# Patient Record
Sex: Male | Born: 1980 | Race: White | Hispanic: No | Marital: Married | Smoking: Never smoker
Health system: Southern US, Community
[De-identification: ages and names within clinical notes are randomized; demographics above are authoritative.]

## PROBLEM LIST (undated history)

## (undated) DIAGNOSIS — K219 Gastro-esophageal reflux disease without esophagitis: Secondary | ICD-10-CM

## (undated) DIAGNOSIS — E785 Hyperlipidemia, unspecified: Secondary | ICD-10-CM

## (undated) DIAGNOSIS — R51 Headache: Secondary | ICD-10-CM

## (undated) DIAGNOSIS — I10 Essential (primary) hypertension: Secondary | ICD-10-CM

## (undated) DIAGNOSIS — R519 Headache, unspecified: Secondary | ICD-10-CM

## (undated) DIAGNOSIS — G43909 Migraine, unspecified, not intractable, without status migrainosus: Secondary | ICD-10-CM

## (undated) HISTORY — DX: Headache: R51

## (undated) HISTORY — DX: Hyperlipidemia, unspecified: E78.5

## (undated) HISTORY — DX: Headache, unspecified: R51.9

## (undated) HISTORY — DX: Migraine, unspecified, not intractable, without status migrainosus: G43.909

## (undated) HISTORY — DX: Essential (primary) hypertension: I10

## (undated) HISTORY — DX: Gastro-esophageal reflux disease without esophagitis: K21.9

---
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
## 2018-09-12 ENCOUNTER — Ambulatory Visit: Payer: Self-pay | Admitting: Family Medicine

## 2018-10-22 ENCOUNTER — Ambulatory Visit: Payer: BC Managed Care – PPO | Admitting: Family Medicine

## 2018-10-22 ENCOUNTER — Encounter: Payer: Self-pay | Admitting: Family Medicine

## 2018-10-22 VITALS — BP 130/90 | HR 74 | Temp 98.4°F | Resp 12 | Ht 71.0 in | Wt 257.4 lb

## 2018-10-22 DIAGNOSIS — E785 Hyperlipidemia, unspecified: Secondary | ICD-10-CM

## 2018-10-22 DIAGNOSIS — G43009 Migraine without aura, not intractable, without status migrainosus: Secondary | ICD-10-CM

## 2018-10-22 DIAGNOSIS — Z23 Encounter for immunization: Secondary | ICD-10-CM

## 2018-10-22 DIAGNOSIS — I10 Essential (primary) hypertension: Secondary | ICD-10-CM

## 2018-10-22 DIAGNOSIS — F411 Generalized anxiety disorder: Secondary | ICD-10-CM

## 2018-10-22 DIAGNOSIS — E559 Vitamin D deficiency, unspecified: Secondary | ICD-10-CM

## 2018-10-22 MED ORDER — PROPRANOLOL HCL ER 120 MG PO CP24: 120.0000 mg | ORAL_CAPSULE | Freq: Every day | ORAL | 2 refills | Status: DC

## 2018-10-22 MED ORDER — CITALOPRAM HYDROBROMIDE 20 MG PO TABS: 20.0000 mg | ORAL_TABLET | Freq: Every day | ORAL | 2 refills | Status: DC

## 2018-10-22 MED ORDER — SUMATRIPTAN SUCCINATE 100 MG PO TABS: 100.0000 mg | ORAL_TABLET | Freq: Every day | ORAL | 3 refills | Status: DC | PRN

## 2018-10-22 NOTE — Patient Instructions (Addendum)
A few things to remember from today's visit:   Migraine without aura and without status migrainosus, not intractable - Plan: propranolol ER (INDERAL LA) 120 MG 24 hr capsule, SUMAtriptan (IMITREX) 100 MG tablet  Hypertension, essential, benign - Plan: Comprehensive metabolic panel, propranolol ER (INDERAL LA) 120 MG 24 hr capsule  Hyperlipidemia, unspecified hyperlipidemia type - Plan: Comprehensive metabolic panel, Lipid panel  GAD (generalized anxiety disorder) - Plan: citalopram (CELEXA) 20 MG tablet  Vitamin D deficiency, unspecified - Plan: VITAMIN D 25 Hydroxy (Vit-D Deficiency, Fractures)  Continue monitoring blood pressures.  Please be sure medication list is accurate. If a new problem present, please set up appointment sooner than planned today.

## 2018-10-22 NOTE — Progress Notes (Signed)
HPI:   Leon Mccullough is a 38 y.o. male, who is here today to establish care.  Former PCP: N/A Last preventive routine visit: >a year ago.  Chronic medical problems: Migraine,HLD,elevated blood pressure,vit D deficiency,and anxiety among some.  He takes Imitrex for migraines. Temporal,either side, no visual aura. Associated nausea and photophobia. No vomiting. When he takes Imitrex 100 mg headache lasts about 5 hours.  10-12 headaches per months. Exacerbated by stress.  Anxiety,he is on Citalopram 20 mg daily. Dose was decreased from 40 mg, this dose caused biting cheeks. + Palpitation with episodes of acute anxiety.  Denies depressed mood or suicidal thoughts. Problem exacerbated by stress.  Concerns today: Elevated BP  He has been checking BP and it has been elevated. 130's-150's/70-80's. Denies visual changes, chest pain, dyspnea, claudication, focal weakness, or edema.   HLD, last FLP 6 months ago. LDL was 194. He takes OTC Fish oil 1000 mg daily and Pravastatin 10 mg daily. He is tolerating med well,no side effects noted.  He has not been consistent with a healthful diet. He is not exercising regularly but planning in doing so.  Vit D deficiency, he is on Vit D 5000 U daily. Dx about 6 years ago.   Review of Systems  Constitutional: Negative for activity change, appetite change, fatigue and fever.  HENT: Negative for nosebleeds, sore throat and trouble swallowing.   Eyes: Negative for redness and visual disturbance.  Respiratory: Negative for cough, shortness of breath and wheezing.   Cardiovascular: Positive for palpitations. Negative for chest pain and leg swelling.  Gastrointestinal: Negative for abdominal pain, nausea and vomiting.  Genitourinary: Negative for decreased urine volume and hematuria.  Musculoskeletal: Negative for gait problem and myalgias.  Neurological: Positive for headaches. Negative for dizziness, syncope and weakness.    Psychiatric/Behavioral: Negative for confusion. The patient is nervous/anxious.       Current Outpatient Medications on File Prior to Visit  Medication Sig Dispense Refill  . Cholecalciferol (VITAMIN D3) 50 MCG (2000 UT) TABS Take 50 mcg by mouth 2 (two) times a week.    . Multiple Vitamins-Minerals (MULTIVITAMIN WITH MINERALS) tablet Take 1 tablet by mouth 2 (two) times a week.    . Omega-3 Fatty Acids (SUPER OMEGA 3 EPA/DHA PO) Take 100 mg by mouth daily.    . pravastatin (PRAVACHOL) 10 MG tablet Take 10 mg by mouth daily.     No current facility-administered medications on file prior to visit.      Past Medical History:  Diagnosis Date  . Frequent headaches   . GERD (gastroesophageal reflux disease)   . Hyperlipidemia   . Hypertension   . Migraines    No Known Allergies  Family History  Problem Relation Age of Onset  . Hyperlipidemia Mother   . Hyperlipidemia Father   . Cancer Maternal Grandfather   . Heart attack Paternal Grandmother   . Stroke Paternal Grandfather     Social History   Socioeconomic History  . Marital status: Married    Spouse name: Not on file  . Number of children: Not on file  . Years of education: Not on file  . Highest education level: Not on file  Occupational History  . Not on file  Social Needs  . Financial resource strain: Not on file  . Food insecurity:    Worry: Not on file    Inability: Not on file  . Transportation needs:    Medical: Not on file  Non-medical: Not on file  Tobacco Use  . Smoking status: Never Smoker  . Smokeless tobacco: Never Used  Substance and Sexual Activity  . Alcohol use: Not Currently  . Drug use: Never  . Sexual activity: Yes    Partners: Female  Lifestyle  . Physical activity:    Days per week: Not on file    Minutes per session: Not on file  . Stress: Not on file  Relationships  . Social connections:    Talks on phone: Not on file    Gets together: Not on file    Attends religious  service: Not on file    Active member of club or organization: Not on file    Attends meetings of clubs or organizations: Not on file    Relationship status: Not on file  Other Topics Concern  . Not on file  Social History Narrative  . Not on file    Vitals:   10/22/18 1411  BP: 130/90  Pulse: 74  Resp: 12  Temp: 98.4 F (36.9 C)  SpO2: 97%    Body mass index is 35.9 kg/m.  Physical Exam  Nursing note reviewed. Constitutional: He is oriented to person, place, and time. He appears well-developed. No distress.  HENT:  Head: Normocephalic and atraumatic.  Mouth/Throat: Oropharynx is clear and moist and mucous membranes are normal.  Eyes: Pupils are equal, round, and reactive to light. Conjunctivae are normal.  Cardiovascular: Normal rate and regular rhythm.  No murmur heard. Pulses:      Dorsalis pedis pulses are 2+ on the right side and 2+ on the left side.  Respiratory: Effort normal and breath sounds normal. No respiratory distress.  GI: Soft. He exhibits no mass. There is no hepatomegaly. There is no abdominal tenderness.  Musculoskeletal:        General: No edema.  Lymphadenopathy:    He has no cervical adenopathy.  Neurological: He is alert and oriented to person, place, and time. He has normal strength. No cranial nerve deficit. Gait normal.  Skin: Skin is warm. No rash noted. No erythema.  Psychiatric: He has a normal mood and affect.  Well groomed, good eye contact.     ASSESSMENT AND PLAN:  Leon Mccullough was seen today for establish care.  Diagnoses and all orders for this visit:  Lab Results  Component Value Date   ALT 46 10/24/2018   AST 27 10/24/2018   ALKPHOS 56 10/24/2018   BILITOT 0.5 10/24/2018   Lab Results  Component Value Date   CHOL 195 10/24/2018   HDL 37.60 (L) 10/24/2018   LDLCALC 124 (H) 10/24/2018   TRIG 167.0 (H) 10/24/2018   CHOLHDL 5 10/24/2018   Lab Results  Component Value Date   CREATININE 0.84 10/24/2018   BUN 13  10/24/2018   NA 139 10/24/2018   K 4.4 10/24/2018   CL 102 10/24/2018   CO2 31 10/24/2018    Migraine without aura and without status migrainosus, not intractable Having several episodes per months. After discussion of a few treatment options,he  Agrees with trying Propranolol. Side effects discussed. F/U in 2 months,before if needed.  -     propranolol ER (INDERAL LA) 120 MG 24 hr capsule; Take 1 capsule (120 mg total) by mouth daily. -     SUMAtriptan (IMITREX) 100 MG tablet; Take 1 tablet (100 mg total) by mouth daily as needed for migraine (can repeat dose 2 hours after if needed.). May repeat in 2 hours if headache  persists or recurs.  Hypertension, essential, benign Not well controlled. Possible complications of elevated BP discussed. Propranolol started today. Continue monitoring BP at home. Instructed about warning signs. F/u in 2 months,before if needed.  -     Comprehensive metabolic panel; Future -     propranolol ER (INDERAL LA) 120 MG 24 hr capsule; Take 1 capsule (120 mg total) by mouth daily.  Hyperlipidemia, unspecified hyperlipidemia type No changes in current management, will follow labs done today and will give further recommendations accordingly.  -     Comprehensive metabolic panel; Future -     Lipid panel; Future  GAD (generalized anxiety disorder) Stable. No changes in current management.  -     citalopram (CELEXA) 20 MG tablet; Take 1 tablet (20 mg total) by mouth daily.  Vitamin D deficiency, unspecified No changes in current management, will follow 2225 [place] vit D done today and will give further recommendations accordingly.  -     VITAMIN D 25 Hydroxy (Vit-D Deficiency, Fractures); Future      Return in about 2 months (around 12/21/2018) for HTN and HA. Labs in 2 days.      Betty G. Swazil[place]Jordan, MD  [facility]. [place] office.

## 2018-10-24 ENCOUNTER — Other Ambulatory Visit (INDEPENDENT_AMBULATORY_CARE_PROVIDER_SITE_OTHER): Payer: BC Managed Care – PPO

## 2018-10-24 DIAGNOSIS — E559 Vitamin D deficiency, unspecified: Secondary | ICD-10-CM | POA: Diagnosis not present

## 2018-10-24 DIAGNOSIS — E785 Hyperlipidemia, unspecified: Secondary | ICD-10-CM | POA: Diagnosis not present

## 2018-10-24 DIAGNOSIS — I10 Essential (primary) hypertension: Secondary | ICD-10-CM | POA: Diagnosis not present

## 2018-10-24 LAB — LIPID PANEL
CHOL/HDL RATIO: 5
Cholesterol: 195 mg/dL (ref 0–200)
HDL: 37.6 mg/dL — ABNORMAL LOW (ref >39.00)
LDL Cholesterol: 124 mg/dL — ABNORMAL HIGH (ref 0–99)
NonHDL: 157.83
Triglycerides: 167 mg/dL — ABNORMAL HIGH (ref 0.0–149.0)
VLDL: 33.4 mg/dL (ref 0.0–40.0)

## 2018-10-24 LAB — COMPREHENSIVE METABOLIC PANEL
ALT: 46 U/L (ref 0–53)
AST: 27 U/L (ref 0–37)
Albumin: 4.4 g/dL (ref 3.5–5.2)
Alkaline Phosphatase: 56 U/L (ref 39–117)
BUN: 13 mg/dL (ref 6–23)
CO2: 31 meq/L (ref 19–32)
Calcium: 9.8 mg/dL (ref 8.4–10.5)
Chloride: 102 mEq/L (ref 96–112)
Creatinine, Ser: 0.84 mg/dL (ref 0.40–1.50)
GFR: 102.76 mL/min (ref >60.00)
Glucose, Bld: 86 mg/dL (ref 70–99)
Potassium: 4.4 mEq/L (ref 3.5–5.1)
Sodium: 139 mEq/L (ref 135–145)
Total Bilirubin: 0.5 mg/dL (ref 0.2–1.2)
Total Protein: 7 g/dL (ref 6.0–8.3)

## 2018-10-24 LAB — VITAMIN D 25 HYDROXY (VIT D DEFICIENCY, FRACTURES): VITD: 23.45 ng/mL — ABNORMAL LOW (ref 30.00–100.00)

## 2018-10-25 MED ORDER — PRAVASTATIN SODIUM 20 MG PO TABS: 20.0000 mg | ORAL_TABLET | Freq: Every day | ORAL | 2 refills | Status: DC

## 2018-10-25 MED ORDER — VITAMIN D (ERGOCALCIFEROL) 1.25 MG (50000 UNIT) PO CAPS: 50000.0000 [IU] | ORAL_CAPSULE | ORAL | 1 refills | Status: DC

## 2018-10-31 ENCOUNTER — Telehealth: Payer: Self-pay | Admitting: Family Medicine

## 2018-10-31 NOTE — Telephone Encounter (Signed)
Copied from CRM [id number]. Topic: General - Other >> Oct 31, 2018 10:47 AM Gerrianne Scale wrote: Reason for CRM: pt calling about lab results

## 2018-12-21 ENCOUNTER — Ambulatory Visit: Payer: BC Managed Care – PPO | Admitting: Family Medicine

## 2019-01-14 ENCOUNTER — Other Ambulatory Visit: Payer: Self-pay | Admitting: Family Medicine

## 2019-01-14 DIAGNOSIS — I10 Essential (primary) hypertension: Secondary | ICD-10-CM

## 2019-01-14 DIAGNOSIS — G43009 Migraine without aura, not intractable, without status migrainosus: Secondary | ICD-10-CM

## 2019-01-30 ENCOUNTER — Ambulatory Visit: Payer: Self-pay

## 2019-01-30 NOTE — Telephone Encounter (Signed)
Pt called to say that he is having headaches after running out of his migraine/BP medication. Per patient the doctor prescribed the Propanolol for migraines and to reduce his BP. Monday his BP 152/76, Tuesday BP 158/68 HR range from 76-80. He states that his headache is different from his migraine HA.  It starts on the top of his head instead of his temple. He has been out of his medication for 4 days. He rates his headache pain at 6-7. He has no blurred vision weakness on one side of his body. Care advice read to patient. Pt verbalized understanding of all instructions.  Note will be routed to office for appointment in the AM. E-mail verified. Reason for Disposition . Ran out of BP medications  Additional Information . Commented on: Systolic BP  >= 160 OR Diastolic >= 100    Having headaches  Answer Assessment - Initial Assessment Questions 1. BLOOD PRESSURE: "What is the blood pressure?" "Did you take at least two measurements 5 minutes apart?"     152/76 158/68 Hr 76-80 on Monday and Tuesday 2. ONSET: "When did you take your blood pressure?"     This week 3. HOW: "How did you obtain the blood pressure?" (e.g., visiting nurse, automatic home BP monitor)     Home machine 4. HISTORY: "Do you have a history of high blood pressure?"    HTN 5. MEDICATIONS: "Are you taking any medications for blood pressure?" "Have you missed any doses recently?"    Propanolol 6. OTHER SYMPTOMS: "Do you have any symptoms?" (e.g., headache, chest pain, blurred vision, difficulty breathing, weakness)     Headache,  7. PREGNANCY: "Is there any chance you are pregnant?" "When was your last menstrual period?"    N/A  Protocols used: HIGH BLOOD PRESSURE-A-AH

## 2019-01-30 NOTE — Telephone Encounter (Signed)
Call returned to patient. Left VM to return call to office. 

## 2019-02-04 ENCOUNTER — Ambulatory Visit: Payer: BC Managed Care – PPO | Admitting: Family Medicine

## 2019-02-06 ENCOUNTER — Other Ambulatory Visit: Payer: Self-pay

## 2019-02-06 ENCOUNTER — Ambulatory Visit (INDEPENDENT_AMBULATORY_CARE_PROVIDER_SITE_OTHER): Payer: BC Managed Care – PPO | Admitting: Family Medicine

## 2019-02-06 ENCOUNTER — Encounter: Payer: Self-pay | Admitting: Family Medicine

## 2019-02-06 DIAGNOSIS — I10 Essential (primary) hypertension: Secondary | ICD-10-CM

## 2019-02-06 DIAGNOSIS — F411 Generalized anxiety disorder: Secondary | ICD-10-CM

## 2019-02-06 DIAGNOSIS — G43009 Migraine without aura, not intractable, without status migrainosus: Secondary | ICD-10-CM | POA: Diagnosis not present

## 2019-02-06 MED ORDER — PROPRANOLOL HCL ER 120 MG PO CP24: 120.0000 mg | ORAL_CAPSULE | Freq: Every day | ORAL | 2 refills | Status: DC

## 2019-02-06 NOTE — Progress Notes (Signed)
Virtual Visit via Video Note   I connected with Leon Mccullough on 02/06/19 at  3:30 PM EDT by a video enabled telemedicine application and verified that I am speaking with the correct person using two identifiers.  Location patient: home Location provider:work or home office Persons participating in the virtual visit: patient, provider  I discussed the limitations of evaluation and management by telemedicine and the availability of in person appointments.He expressed understanding and agreed to proceed.   HPI: Leon Mccullough is a 38 yo male following on some chronic medical problems.  He was last seen on 10/22/2018, 2 months follow-up was recommended.  Migraines, he was started on Inderal LA 120 mg daily and Imitrex 100 mg to take if needed. Since his last OV he had an episode of mild right temporofrontal headache that lasted about 30 minutes. He has not had migraine episodes. He is tolerating med well ,no side effects.  Hypertension, is taking Inderal LA 120 mg daily. BP readings with med 120-130's/80's. He ran out of medication for about 7 to 8 days, BP was elevated at 168/77. Denies visual changes, chest pain, dyspnea, palpitations, abdominal pain, nausea, vomiting, edema, or focal deficit.  Lab Results  Component Value Date   CREATININE 0.84 10/24/2018   BUN 13 10/24/2018   NA 139 10/24/2018   K 4.4 10/24/2018   CL 102 10/24/2018   CO2 31 10/24/2018   Anxiety, currently he is on Celexa 20 mg daily. Medication has helped with symptoms. He thinks he is doing well with current situation related with COVID-19 pandemia. He denies depressed mood or suicidal thoughts.   ROS: See pertinent positives and negatives per HPI.  Past Medical History:  Diagnosis Date  . Frequent headaches   . GERD (gastroesophageal reflux disease)   . Hyperlipidemia   . Hypertension   . Migraines     Past Surgical History:  Procedure Laterality Date  . TONSILLECTOMY AND ADENOIDECTOMY  2001     Family History  Problem Relation Age of Onset  . Hyperlipidemia Mother   . Hyperlipidemia Father   . Cancer Maternal Grandfather   . Heart attack Paternal Grandmother   . Stroke Paternal Grandfather     Social History   Socioeconomic History  . Marital status: Married    Spouse name: Not on file  . Number of children: Not on file  . Years of education: Not on file  . Highest education level: Not on file  Occupational History  . Not on file  Social Needs  . Financial resource strain: Not on file  . Food insecurity:    Worry: Not on file    Inability: Not on file  . Transportation needs:    Medical: Not on file    Non-medical: Not on file  Tobacco Use  . Smoking status: Never Smoker  . Smokeless tobacco: Never Used  Substance and Sexual Activity  . Alcohol use: Not Currently  . Drug use: Never  . Sexual activity: Yes    Partners: Female  Lifestyle  . Physical activity:    Days per week: Not on file    Minutes per session: Not on file  . Stress: Not on file  Relationships  . Social connections:    Talks on phone: Not on file    Gets together: Not on file    Attends religious service: Not on file    Active member of club or organization: Not on file    Attends meetings of clubs or organizations:  Not on file    Relationship status: Not on file  . Intimate partner violence:    Fear of current or ex partner: Not on file    Emotionally abused: Not on file    Physically abused: Not on file    Forced sexual activity: Not on file  Other Topics Concern  . Not on file  Social History Narrative  . Not on file      Current Outpatient Medications:  .  Cholecalciferol (VITAMIN D3) 50 MCG (2000 UT) TABS, Take 50 mcg by mouth 2 (two) times a week., Disp: , Rfl:  .  citalopram (CELEXA) 20 MG tablet, Take 1 tablet (20 mg total) by mouth daily., Disp: 90 tablet, Rfl: 2 .  Multiple Vitamins-Minerals (MULTIVITAMIN WITH MINERALS) tablet, Take 1 tablet by mouth 2 (two)  times a week., Disp: , Rfl:  .  Omega-3 Fatty Acids (SUPER OMEGA 3 EPA/DHA PO), Take 100 mg by mouth daily., Disp: , Rfl:  .  pravastatin (PRAVACHOL) 20 MG tablet, Take 1 tablet (20 mg total) by mouth daily., Disp: 90 tablet, Rfl: 2 .  propranolol ER (INDERAL LA) 120 MG 24 hr capsule, Take 1 capsule (120 mg total) by mouth daily., Disp: 90 capsule, Rfl: 2 .  SUMAtriptan (IMITREX) 100 MG tablet, Take 1 tablet (100 mg total) by mouth daily as needed for migraine (can repeat dose 2 hours after if needed.). May repeat in 2 hours if headache persists or recurs., Disp: 10 tablet, Rfl: 3 .  Vitamin D, Ergocalciferol, (DRISDOL) 1.25 MG (50000 UT) CAPS capsule, Take 1 capsule (50,000 Units total) by mouth every 7 (seven) days., Disp: 12 capsule, Rfl: 1  EXAM:  VITALS per patient if applicable:BP 132/76   Pulse 64   Resp 12   GENERAL: alert, oriented, appears well and in no acute distress  HEENT: atraumatic, conjunctiva clear, no obvious facial abnormalities on inspection.  LUNGS: on inspection no signs of respiratory distress, breathing rate appears normal, no obvious gross SOB, gasping or wheezing  CV: no obvious cyanosis  MS: moves all visible extremities without noticeable abnormality  PSYCH/NEURO: pleasant and cooperative, no obvious depression or anxiety, speech and thought processing grossly intact  ASSESSMENT AND PLAN:  Discussed the following assessment and plan:  Hypertension, essential, benign BP better controlled. No changes in Inderal LA 120 mg daily. Continue low salt diet. Continue monitoring BP regularly. Annual eye exam. F/U in 4-5 months.  GAD (generalized anxiety disorder) Still well controlled with Celexa 20 mg daily. No changes today.  Migraine without aura and without status migrainosus, not intractable Great improvement in headache frequency and intensity. Continue Inderal LA 120 mg daily. Instructed about warning signs. F/U in 4-5 months.     I discussed  the assessment and treatment plan with the patient. He was provided an opportunity to ask questions and all were answered. The patient agreed with the plan and demonstrated an understanding of the instructions.    Return in about 5 months (around 07/09/2019) for HTN,migraine.    Catelyn Friel [place], MD

## 2019-02-06 NOTE — Assessment & Plan Note (Signed)
BP better controlled. No changes in Inderal LA 120 mg daily. Continue low salt diet. Continue monitoring BP regularly. Annual eye exam. F/U in 4-5 months.

## 2019-02-06 NOTE — Assessment & Plan Note (Signed)
Still well controlled with Celexa 20 mg daily. No changes today.

## 2019-02-06 NOTE — Assessment & Plan Note (Signed)
Great improvement in headache frequency and intensity. Continue Inderal LA 120 mg daily. Instructed about warning signs. F/U in 4-5 months.

## 2019-04-16 ENCOUNTER — Other Ambulatory Visit: Payer: Self-pay | Admitting: Family Medicine

## 2019-04-16 DIAGNOSIS — E559 Vitamin D deficiency, unspecified: Secondary | ICD-10-CM

## 2019-07-11 ENCOUNTER — Other Ambulatory Visit: Payer: Self-pay | Admitting: Family Medicine

## 2019-07-11 DIAGNOSIS — E785 Hyperlipidemia, unspecified: Secondary | ICD-10-CM

## 2019-07-23 ENCOUNTER — Other Ambulatory Visit: Payer: Self-pay | Admitting: Family Medicine

## 2019-07-23 DIAGNOSIS — F411 Generalized anxiety disorder: Secondary | ICD-10-CM

## 2019-10-01 ENCOUNTER — Other Ambulatory Visit: Payer: Self-pay | Admitting: Family Medicine

## 2019-10-01 DIAGNOSIS — E559 Vitamin D deficiency, unspecified: Secondary | ICD-10-CM

## 2019-10-19 ENCOUNTER — Other Ambulatory Visit: Payer: Self-pay | Admitting: Family Medicine

## 2019-10-19 DIAGNOSIS — G43009 Migraine without aura, not intractable, without status migrainosus: Secondary | ICD-10-CM

## 2019-10-19 DIAGNOSIS — I10 Essential (primary) hypertension: Secondary | ICD-10-CM

## 2019-12-19 ENCOUNTER — Ambulatory Visit: Payer: BC Managed Care – PPO | Attending: Family

## 2019-12-19 DIAGNOSIS — Z23 Encounter for immunization: Secondary | ICD-10-CM

## 2019-12-19 NOTE — Progress Notes (Signed)
   Covid-19 Vaccination Clinic  Name:  Chaysen Tillman    MRN: [id number] DOB: 1981-09-26  12/19/2019  Mr. Withrow was observed post Covid-19 immunization for 15 minutes without incident. He was provided with Vaccine Information Sheet and instruction to access the V-Safe system.   Mr. Mallek was instructed to call 911 with any severe reactions post vaccine: Marland Kitchen Difficulty breathing  . Swelling of face and throat  . A fast heartbeat  . A bad rash all over body  . Dizziness and weakness   Immunizations Administered    Name Date Dose VIS Date Route   Moderna COVID-19 Vaccine 12/19/2019 11:02 AM 0.5 mL 08/27/2019 Intramuscular   Manufacturer: Moderna   Lot: [id number]   NDC: 43837-793-96

## 2020-01-21 ENCOUNTER — Ambulatory Visit: Payer: BC Managed Care – PPO | Attending: Family

## 2020-01-21 DIAGNOSIS — Z23 Encounter for immunization: Secondary | ICD-10-CM

## 2020-01-21 NOTE — Progress Notes (Signed)
   Covid-19 Vaccination Clinic  Name:  Leon Mccullough    MRN: [id number] DOB: 06-Jan-1981  01/21/2020  [name] was observed post Covid-19 immunization for 15 minutes without incident. He was provided with Vaccine Information Sheet and instruction to access the V-Safe system.   [name] was instructed to call 911 with any severe reactions post vaccine: Marland Kitchen Difficulty breathing  . Swelling of face and throat  . A fast heartbeat  . A bad rash all over body  . Dizziness and weakness   Immunizations Administered    Name Date Dose VIS Date Route   Moderna COVID-19 Vaccine 01/21/2020  3:04 PM 0.5 mL 08/2019 Intramuscular   Manufacturer: Moderna   Lot: [id number]   NDC: 88933-882-66

## 2020-03-13 ENCOUNTER — Other Ambulatory Visit: Payer: Self-pay | Admitting: Family Medicine

## 2020-03-13 DIAGNOSIS — E559 Vitamin D deficiency, unspecified: Secondary | ICD-10-CM

## 2020-04-08 ENCOUNTER — Other Ambulatory Visit: Payer: Self-pay

## 2020-04-08 ENCOUNTER — Encounter: Payer: Self-pay | Admitting: Family Medicine

## 2020-04-08 ENCOUNTER — Ambulatory Visit (INDEPENDENT_AMBULATORY_CARE_PROVIDER_SITE_OTHER): Payer: BC Managed Care – PPO | Admitting: Family Medicine

## 2020-04-08 VITALS — BP 134/80 | HR 64 | Ht 71.0 in | Wt 267.0 lb

## 2020-04-08 DIAGNOSIS — G43009 Migraine without aura, not intractable, without status migrainosus: Secondary | ICD-10-CM

## 2020-04-08 DIAGNOSIS — Z0000 Encounter for general adult medical examination without abnormal findings: Secondary | ICD-10-CM | POA: Diagnosis not present

## 2020-04-08 DIAGNOSIS — E785 Hyperlipidemia, unspecified: Secondary | ICD-10-CM | POA: Diagnosis not present

## 2020-04-08 DIAGNOSIS — F411 Generalized anxiety disorder: Secondary | ICD-10-CM

## 2020-04-08 DIAGNOSIS — E559 Vitamin D deficiency, unspecified: Secondary | ICD-10-CM | POA: Diagnosis not present

## 2020-04-08 DIAGNOSIS — Z23 Encounter for immunization: Secondary | ICD-10-CM

## 2020-04-08 DIAGNOSIS — Z130 Encounter for screening for diseases of the blood and blood-forming organs and certain disorders involving the immune mechanism: Secondary | ICD-10-CM

## 2020-04-08 DIAGNOSIS — Z1329 Encounter for screening for other suspected endocrine disorder: Secondary | ICD-10-CM

## 2020-04-08 DIAGNOSIS — Z114 Encounter for screening for human immunodeficiency virus [HIV]: Secondary | ICD-10-CM

## 2020-04-08 DIAGNOSIS — Z1159 Encounter for screening for other viral diseases: Secondary | ICD-10-CM

## 2020-04-08 DIAGNOSIS — Z13228 Encounter for screening for other metabolic disorders: Secondary | ICD-10-CM

## 2020-04-08 DIAGNOSIS — Z789 Other specified health status: Secondary | ICD-10-CM

## 2020-04-08 DIAGNOSIS — I10 Essential (primary) hypertension: Secondary | ICD-10-CM | POA: Diagnosis not present

## 2020-04-08 NOTE — Assessment & Plan Note (Signed)
BP adequately controlled. Continue Inderal LA and 20 mg daily. Low-salt diet. Monitor BP regularly.

## 2020-04-08 NOTE — Assessment & Plan Note (Signed)
Problem is well controlled. Continue Celexa 20 mg daily.

## 2020-04-08 NOTE — Assessment & Plan Note (Signed)
Problem is better controlled. No changes in current management.

## 2020-04-08 NOTE — Assessment & Plan Note (Signed)
No changes in current management, will follow labs done today and will give further recommendations accordingly.  

## 2020-04-08 NOTE — Patient Instructions (Addendum)
A few things to remember from today's visit:   Routine general medical examination at a health care facility  Vitamin D deficiency, unspecified  Hypertension, essential, benign - Plan: Comprehensive metabolic panel, Lipid panel  Hyperlipidemia, unspecified hyperlipidemia type - Plan: Comprehensive metabolic panel  Encounter for HCV screening test for low risk patient - Plan: Hepatitis C antibody screen  Encounter for screening for HIV - Plan: HIV antibody  Screening for endocrine, metabolic and immunity disorder - Plan: Hemoglobin A1c  If you need refills please call your pharmacy. Do not use My Chart to request refills or for acute issues that need immediate attention.    Please be sure medication list is accurate. If a new problem present, please set up appointment sooner than planned today.   At least 150 minutes of moderate exercise per week, daily brisk walking for 15-30 min is a good exercise option. Healthy diet low in saturated (animal) fats and sweets and consisting of fresh fruits and vegetables, lean meats such as fish and white chicken and whole grains.  - Vaccines:  Tdap vaccine every 10 years.  Shingles vaccine recommended at age 73, could be given after 39 years of age but not sure about insurance coverage.  Pneumonia vaccines: Pneumovax at 665   -Screening recommendations for low/normal risk males:  Screening for diabetes at age 42 and every 3 years. Earlier screening if cardiovascular risk factors.   Lipid screening at 35 and every 3 years. Screening starts in younger males with cardiovascular risk factors. N/A  Colon cancer screening is now at age 53 but your insurance may not cover until age 41 .screening is recommended age 69.  Prostate cancer screening: some controversy, starts usually at 50: Rectal exam and PSA.  Aortic Abdominal Aneurism once between 63 and 4 years old if ever smoker.  Also recommended:  1. Dental visit- Brush and floss your  teeth twice daily; visit your dentist twice a year. 2. Eye doctor- Get an eye exam at least every 2 years. 3. Helmet use- Always wear a helmet when riding a bicycle, motorcycle, rollerblading or skateboarding. 4. Safe sex- If you may be exposed to sexually transmitted infections, use a condom. 5. Seat belts- Seat belts can save your live; always wear one. 6. Smoke/Carbon Monoxide detectors- These detectors need to be installed on the appropriate level of your home. Replace batteries at least once a year. 7. Skin cancer- When out in the sun please cover up and use sunscreen 15 SPF or higher. 8. Violence- If anyone is threatening or hurting you, please tell your healthcare provider.  9. Drink alcohol in moderation- Limit alcohol intake to one drink or less per day. Never drink and drive.

## 2020-04-08 NOTE — Progress Notes (Signed)
HPI:  Mr. Leon Mccullough is a 39 y.o.male here today for his routine physical examination.  Last CPE: 1-2 years ago. He lives with with his wife.  Regular exercise 3 or more times per week: 3 to 4 weeks ago he started swimming every times per week. Following a healthy diet: He tries, in general he does follow a healthful diet.  He is cakes once per week and candy 2-3 times per week.  Chronic medical problems: Hypertension, migraine, anxiety, vitamin D deficiency, and hyperlipidemia among some.  Hx of STD's: Negative.  Immunization History  Administered Date(s) Administered   Influenza,inj,Quad PF,6+ Mos 07/25/2018   MMR 04/08/2020   Moderna SARS-COVID-2 Vaccination 12/19/2019, 01/21/2020   Tdap 10/22/2018   -Hep C screening: Never. Last colon cancer screening: N/A Last prostate ca screening: N/A  -Negative for high alcohol intake, tobacco use, or Hx of illicit drug use.  -Concerns and/or follow up today:   NUU:[id number] is on pravastatin 20 mg daily.  Lab Results  Component Value Date   CHOL 195 10/24/2018   HDL 37.60 (L) 10/24/2018   LDLCALC 124 (H) 10/24/2018   TRIG 167.0 (H) 10/24/2018   CHOLHDL 5 10/24/2018   Hypertension: Currently he is on Inderal LA 120 mg daily.  Lab Results  Component Value Date   CREATININE 0.84 10/24/2018   BUN 13 10/24/2018   NA 139 10/24/2018   K 4.4 10/24/2018   CL 102 10/24/2018   CO2 31 10/24/2018   Migraines headaches have improved since he started Inderal. He has 2 mild headaches per month. Tolerating medication well.  Anxiety: He is on Celexa 20 mg daily. Negative for depressed mood.  Review of Systems  Constitutional: Negative for activity change, appetite change, fatigue and fever.  HENT: Negative for dental problem, nosebleeds, sore throat and trouble swallowing.   Eyes: Negative for redness and visual disturbance.  Respiratory: Negative for cough, shortness of breath and wheezing.   Cardiovascular: Negative  for chest pain, palpitations and leg swelling.  Gastrointestinal: Negative for abdominal pain, blood in stool, nausea and vomiting.  Endocrine: Negative for cold intolerance, heat intolerance, polydipsia, polyphagia and polyuria.  Genitourinary: Negative for decreased urine volume, discharge, dysuria, genital sores, hematuria and testicular pain.  Musculoskeletal: Negative for gait problem and myalgias. Occasional shooting pain left elbow,medial aspect, radiated to 4th-5th finger and just with certain positions(applying pressure on area) in bed. Skin: Negative for color change and rash.  Allergic/Immunologic: Negative for environmental allergies.  Neurological: Negative for syncope, weakness and facial asymmetry. Hematological: Negative for adenopathy. Does not bruise/bleed easily.  Psychiatric/Behavioral: Negative for confusion. The patient is not nervous/anxious.   All other systems reviewed and are negative.  Current Outpatient Medications on File Prior to Visit  Medication Sig Dispense Refill   Cholecalciferol (VITAMIN D3) 50 MCG (2000 UT) TABS Take 50 mcg by mouth 2 (two) times a week.     citalopram (CELEXA) 20 MG tablet TAKE 1 TABLET BY MOUTH EVERY DAY 90 tablet 2   Multiple Vitamins-Minerals (MULTIVITAMIN WITH MINERALS) tablet Take 1 tablet by mouth 2 (two) times a week.     Omega-3 Fatty Acids (SUPER OMEGA 3 EPA/DHA PO) Take 100 mg by mouth daily.     pravastatin (PRAVACHOL) 20 MG tablet TAKE 1 TABLET BY MOUTH EVERY DAY 90 tablet 2   propranolol ER (INDERAL LA) 120 MG 24 hr capsule TAKE 1 CAPSULE BY MOUTH EVERY DAY 90 capsule 1   SUMAtriptan (IMITREX) 100 MG tablet Take 1 tablet (100 mg  total) by mouth daily as needed for migraine (can repeat dose 2 hours after if needed.). May repeat in 2 hours if headache persists or recurs. 10 tablet 3   No current facility-administered medications on file prior to visit.   Past Medical History:  Diagnosis Date   Frequent headaches     GERD (gastroesophageal reflux disease)    Hyperlipidemia    Hypertension    Migraines    Past Surgical History:  Procedure Laterality Date   TONSILLECTOMY AND ADENOIDECTOMY  2001   No Known Allergies  Family History  Problem Relation Age of Onset   Hyperlipidemia Mother    Hyperlipidemia Father    Cancer Maternal Grandfather    Heart attack Paternal Grandmother    Stroke Paternal Grandfather    Social History   Socioeconomic History   Marital status: Married    Spouse name: Not on file   Number of children: Not on file   Years of education: Not on file   Highest education level: Not on file  Occupational History   Not on file  Tobacco Use   Smoking status: Never Smoker   Smokeless tobacco: Never Used  Vaping Use   Vaping Use: Never assessed  Substance and Sexual Activity   Alcohol use: Not Currently   Drug use: Never   Sexual activity: Yes    Partners: Female  Other Topics Concern   Not on file  Social History Narrative   Not on file   Social Determinants of Health   Financial Resource Strain:    Difficulty of Paying Living Expenses:   Food Insecurity:    Worried About Charity fundraiser in the Last Year:    Arboriculturist in the Last Year:   Transportation Needs:    Film/video editor (Medical):    Lack of Transportation (Non-Medical):   Physical Activity:    Days of Exercise per Week:    Minutes of Exercise per Session:   Stress:    Feeling of Stress :   Social Connections:    Frequency of Communication with Friends and Family:    Frequency of Social Gatherings with Friends and Family:    Attends Religious Services:    Active Member of Clubs or Organizations:    Attends Archivist Meetings:    Marital Status:     Today's Vitals   04/08/20 1113  BP: 134/80  Pulse: 64  SpO2: 96%  Weight: 267 lb (121.1 kg)  Height: '5\' 11"'  (1.803 m)   Body mass index is 37.24 kg/m.  Wt Readings from Last 3  Encounters:  04/08/20 267 lb (121.1 kg)  10/22/18 257 lb 6 oz (116.7 kg)   Physical Exam  Nursing note and vitals reviewed. Constitutional: He is oriented to person, place, and time. He appears well-developed. No distress.  HENT:  Head: Normocephalic and atraumatic.  Right Ear: Tympanic membrane, external ear and ear canal normal.  Left Ear: Tympanic membrane, external ear and ear canal normal.  Mouth/Throat: Oropharynx is clear and moist and mucous membranes are normal.  Eyes: Pupils are equal, round, and reactive to light. Conjunctivae and EOM are normal.  Neck: Normal range of motion. No tracheal deviation present. No thyromegaly present.  Cardiovascular: Normal rate and regular rhythm.  No murmur heard. Pulses:      Dorsalis pedis pulses are 2+ on the right side and 2+ on the left side.  Respiratory: Effort normal and breath sounds normal. No respiratory distress.  GI:  Soft. He exhibits no mass. There is no hepatomegaly. There is no abdominal tenderness.  Genitourinary:    Genitourinary Comments: No concerns.   Musculoskeletal:        General: No tenderness or edema.     Comments: No major deformities appreciated and no signs of synovitis.  Lymphadenopathy:    He has no cervical adenopathy.       Right: No supraclavicular adenopathy present.       Left: No supraclavicular adenopathy present.  Neurological: He is alert and oriented to person, place, and time. He has normal strength. No cranial nerve deficit or sensory deficit. Coordination and gait normal.  Reflex Scores:      Bicep reflexes are 2+ on the right side and 2+ on the left side.      Patellar reflexes are 2+ on the right side and 2+ on the left side. Skin: Skin is warm. No erythema.  Psychiatric: He has a normal mood and affect. Cognition and memory are normal.  Well groomed,good eye contact.   ASSESSMENT AND PLAN:  Mr. Leon Mccullough was here today annual physical examination.  Diagnoses and all orders for  this visit:  Orders Placed This Encounter  Procedures   MMR vaccine subcutaneous   VITAMIN D 25 Hydroxy (Vit-D Deficiency, Fractures)   Hepatitis B surface antibody,qualitative   Comprehensive metabolic panel   Hemoglobin A1c   Hepatitis C antibody screen   HIV antibody   Lipid panel   Lab Results  Component Value Date   CHOL 160 04/08/2020   HDL 43 04/08/2020   LDLCALC 88 04/08/2020   TRIG 196 (H) 04/08/2020   CHOLHDL 3.7 04/08/2020   Lab Results  Component Value Date   HGBA1C 5.6 04/08/2020   Lab Results  Component Value Date   CREATININE 0.88 04/08/2020   BUN 10 04/08/2020   NA 139 04/08/2020   K 4.6 04/08/2020   CL 102 04/08/2020   CO2 27 04/08/2020   Lab Results  Component Value Date   ALT 45 04/08/2020   AST 32 04/08/2020   ALKPHOS 56 10/24/2018   BILITOT 0.4 04/08/2020    Routine general medical examination at a health care facility We discussed the importance of regular physical activity and healthy diet for prevention of chronic illness and/or complications. Preventive guidelines reviewed. Vaccination: Updated, MMR given today.  Next CPE in a year.  Encounter for HCV screening test for low risk patient -     Hepatitis C antibody screen  Encounter for screening for HIV -     HIV antibody  Screening for endocrine, metabolic and immunity disorder -     Hemoglobin A1c  Need for MMR vaccine -     MMR vaccine subcutaneous  Hepatitis B vaccination status unknown - Hepatitis B surface antibody,qualitative   GAD (generalized anxiety disorder) Problem is well controlled. Continue Celexa 20 mg daily.  Hyperlipidemia No changes in current management, will follow labs done today and will give further recommendations accordingly.   Migraine without aura and without status migrainosus, not intractable Problem is better controlled. No changes in current management.  Hypertension, essential, benign BP adequately controlled. Continue  Inderal LA and 20 mg daily. Low-salt diet. Monitor BP regularly.   Return in 1 year (on 04/08/2021) for cpe and f/u.   Harris Kistler G. Martin[place], MD  [facility]. [place] office.   A few things to remember from today's visit:   Routine general medical examination at a health care facility  Vitamin D  deficiency, unspecified  Hypertension, essential, benign - Plan: Comprehensive metabolic panel, Lipid panel  Hyperlipidemia, unspecified hyperlipidemia type - Plan: Comprehensive metabolic panel  Encounter for HCV screening test for low risk patient - Plan: Hepatitis C antibody screen  Encounter for screening for HIV - Plan: HIV antibody  Screening for endocrine, metabolic and immunity disorder - Plan: Hemoglobin A1c  If you need refills please call your pharmacy. Do not use My Chart to request refills or for acute issues that need immediate attention.    Please be sure medication list is accurate. If a new problem present, please set up appointment sooner than planned today.   At least 150 minutes of moderate exercise per week, daily brisk walking for 15-30 min is a good exercise option. Healthy diet low in saturated (animal) fats and sweets and consisting of fresh fruits and vegetables, lean meats such as fish and white chicken and whole grains.  - Vaccines:  Tdap vaccine every 10 years.  Shingles vaccine recommended at age 56, could be given after 39 years of age but not sure about insurance coverage.  Pneumonia vaccines: Pneumovax at 74   -Screening recommendations for low/normal risk males:  Screening for diabetes at age 73 and every 3 years. Earlier screening if cardiovascular risk factors.   Lipid screening at 35 and every 3 years. Screening starts in younger males with cardiovascular risk factors. N/A  Colon cancer screening is now at age 73 but your insurance may not cover until age 52 .screening is recommended age 15.  Prostate cancer screening: some  controversy, starts usually at 33: Rectal exam and PSA.  Aortic Abdominal Aneurism once between 42 and 21 years old if ever smoker.  Also recommended:  1. Dental visit- Brush and floss your teeth twice daily; visit your dentist twice a year. 2. Eye doctor- Get an eye exam at least every 2 years. 3. Helmet use- Always wear a helmet when riding a bicycle, motorcycle, rollerblading or skateboarding. 4. Safe sex- If you may be exposed to sexually transmitted infections, use a condom. 5. Seat belts- Seat belts can save your live; always wear one. 6. Smoke/Carbon Monoxide detectors- These detectors need to be installed on the appropriate level of your home. Replace batteries at least once a year. 7. Skin cancer- When out in the sun please cover up and use sunscreen 15 SPF or higher. 8. Violence- If anyone is threatening or hurting you, please tell your healthcare provider.  9. Drink alcohol in moderation- Limit alcohol intake to one drink or less per day. Never drink and drive.

## 2020-04-09 ENCOUNTER — Other Ambulatory Visit: Payer: Self-pay | Admitting: Family Medicine

## 2020-04-09 DIAGNOSIS — G43009 Migraine without aura, not intractable, without status migrainosus: Secondary | ICD-10-CM

## 2020-04-09 DIAGNOSIS — E785 Hyperlipidemia, unspecified: Secondary | ICD-10-CM

## 2020-04-09 DIAGNOSIS — I10 Essential (primary) hypertension: Secondary | ICD-10-CM

## 2020-04-09 LAB — HEPATITIS C ANTIBODY
Hepatitis C Ab: NONREACTIVE
SIGNAL TO CUT-OFF: 0.01 (ref <1.00)

## 2020-04-09 LAB — HEPATITIS B SURFACE ANTIBODY,QUALITATIVE: Hep B S Ab: REACTIVE — AB

## 2020-04-09 LAB — COMPREHENSIVE METABOLIC PANEL
AG Ratio: 1.6 (calc) (ref 1.0–2.5)
ALT: 45 U/L (ref 9–46)
AST: 32 U/L (ref 10–40)
Albumin: 4.5 g/dL (ref 3.6–5.1)
Alkaline phosphatase (APISO): 59 U/L (ref 36–130)
BUN: 10 mg/dL (ref 7–25)
CO2: 27 mmol/L (ref 20–32)
Calcium: 9.7 mg/dL (ref 8.6–10.3)
Chloride: 102 mmol/L (ref 98–110)
Creat: 0.88 mg/dL (ref 0.60–1.35)
Globulin: 2.8 g/dL (calc) (ref 1.9–3.7)
Glucose, Bld: 91 mg/dL (ref 65–99)
Potassium: 4.6 mmol/L (ref 3.5–5.3)
Sodium: 139 mmol/L (ref 135–146)
Total Bilirubin: 0.4 mg/dL (ref 0.2–1.2)
Total Protein: 7.3 g/dL (ref 6.1–8.1)

## 2020-04-09 LAB — LIPID PANEL
Cholesterol: 160 mg/dL (ref <200)
HDL: 43 mg/dL (ref >=40)
LDL Cholesterol (Calc): 88 mg/dL (calc)
Non-HDL Cholesterol (Calc): 117 mg/dL (calc) (ref <130)
Total CHOL/HDL Ratio: 3.7 (calc) (ref <5.0)
Triglycerides: 196 mg/dL — ABNORMAL HIGH (ref <150)

## 2020-04-09 LAB — HEMOGLOBIN A1C
Hgb A1c MFr Bld: 5.6 % of total Hgb (ref <5.7)
Mean Plasma Glucose: 114 (calc)
eAG (mmol/L): 6.3 (calc)

## 2020-04-09 LAB — HIV ANTIBODY (ROUTINE TESTING W REFLEX): HIV 1&2 Ab, 4th Generation: NONREACTIVE

## 2020-04-09 LAB — VITAMIN D 25 HYDROXY (VIT D DEFICIENCY, FRACTURES): Vit D, 25-Hydroxy: 29 ng/mL — ABNORMAL LOW (ref 30–100)

## 2020-04-15 NOTE — Progress Notes (Signed)
I left a voicemail for patient to return my call.

## 2020-04-21 ENCOUNTER — Other Ambulatory Visit: Payer: Self-pay | Admitting: Family Medicine

## 2020-04-21 DIAGNOSIS — F411 Generalized anxiety disorder: Secondary | ICD-10-CM

## 2020-07-14 ENCOUNTER — Other Ambulatory Visit: Payer: Self-pay | Admitting: Family Medicine

## 2020-07-14 DIAGNOSIS — N4611 Organic oligospermia: Secondary | ICD-10-CM

## 2020-07-14 NOTE — Progress Notes (Signed)
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 He and his wife are planning on going to [place] for "mini IIVF." Order given to his wife. Leon Caughlin [place], MD

## 2020-08-23 ENCOUNTER — Encounter: Payer: Self-pay | Admitting: Family Medicine

## 2020-08-26 ENCOUNTER — Other Ambulatory Visit: Payer: Self-pay

## 2020-08-26 ENCOUNTER — Other Ambulatory Visit: Payer: Self-pay | Admitting: Family Medicine

## 2020-08-26 ENCOUNTER — Other Ambulatory Visit: Payer: BC Managed Care – PPO

## 2020-08-26 DIAGNOSIS — Z1159 Encounter for screening for other viral diseases: Secondary | ICD-10-CM

## 2020-08-27 LAB — HEPATITIS B SURFACE ANTIGEN: Hepatitis B Surface Ag: NONREACTIVE

## 2020-08-27 LAB — HEPATITIS B SURFACE ANTIBODY,QUALITATIVE: Hep B S Ab: REACTIVE — AB

## 2020-10-09 ENCOUNTER — Other Ambulatory Visit: Payer: Self-pay | Admitting: Family Medicine

## 2020-10-09 DIAGNOSIS — G43009 Migraine without aura, not intractable, without status migrainosus: Secondary | ICD-10-CM

## 2020-10-09 DIAGNOSIS — I10 Essential (primary) hypertension: Secondary | ICD-10-CM

## 2021-01-09 ENCOUNTER — Other Ambulatory Visit: Payer: Self-pay | Admitting: Family Medicine

## 2021-01-09 DIAGNOSIS — E785 Hyperlipidemia, unspecified: Secondary | ICD-10-CM

## 2021-01-16 ENCOUNTER — Other Ambulatory Visit: Payer: Self-pay | Admitting: Family Medicine

## 2021-01-16 DIAGNOSIS — F411 Generalized anxiety disorder: Secondary | ICD-10-CM

## 2021-02-15 ENCOUNTER — Encounter: Payer: Self-pay | Admitting: Family Medicine

## 2021-02-15 ENCOUNTER — Ambulatory Visit: Payer: BC Managed Care – PPO | Admitting: Family Medicine

## 2021-02-15 VITALS — BP 138/80 | HR 66 | Temp 98.5°F | Resp 16 | Ht 71.0 in | Wt 255.5 lb

## 2021-02-15 DIAGNOSIS — Z6835 Body mass index (BMI) 35.0-35.9, adult: Secondary | ICD-10-CM | POA: Diagnosis not present

## 2021-02-15 DIAGNOSIS — I10 Essential (primary) hypertension: Secondary | ICD-10-CM | POA: Diagnosis not present

## 2021-02-15 DIAGNOSIS — E669 Obesity, unspecified: Secondary | ICD-10-CM | POA: Insufficient documentation

## 2021-02-15 DIAGNOSIS — G43009 Migraine without aura, not intractable, without status migrainosus: Secondary | ICD-10-CM

## 2021-02-15 MED ORDER — OLMESARTAN MEDOXOMIL 20 MG PO TABS: 10.0000 mg | ORAL_TABLET | Freq: Every day | ORAL | 1 refills | Status: DC

## 2021-02-15 MED ORDER — PROPRANOLOL HCL ER 120 MG PO CP24: 120.0000 mg | ORAL_CAPSULE | Freq: Every day | ORAL | 2 refills | Status: DC

## 2021-02-15 MED ORDER — SUMATRIPTAN SUCCINATE 100 MG PO TABS: 100.0000 mg | ORAL_TABLET | Freq: Every day | ORAL | 3 refills | Status: DC | PRN

## 2021-02-15 NOTE — Assessment & Plan Note (Signed)
He has lost about 12 Lb sine 03/2020. Encouraged to continue consistency with healthy diet and physical activity recommended.

## 2021-02-15 NOTE — Patient Instructions (Addendum)
A few things to remember from today's visit:   Hypertension, essential, benign - Plan: olmesartan (BENICAR) 20 MG tablet, propranolol ER (INDERAL LA) 120 MG 24 hr capsule  Migraine without aura and without status migrainosus, not intractable - Plan: propranolol ER (INDERAL LA) 120 MG 24 hr capsule  If you need refills please call your pharmacy. Do not use My Chart to request refills or for acute issues that need immediate attention.   Today Olmesartan 10 mg added. Lab in 4-5 days. If blood pressure above 130/80 in 3-4 weeks,Olmesartan dose can be increased to 20 mg. No changes in propranolol.  Please be sure medication list is accurate. If a new problem present, please set up appointment sooner than planned today.

## 2021-02-15 NOTE — Progress Notes (Signed)
HPI: [name] is a 40 y.o. male, who is here today for follow up.   He was last seen on 04/08/20. No new problems since his last visit. HTN: Dx'ed in 09/2018. He is on Propranolol ER 120 mg daily. He has tolerated medications well. Medication has also helped with migraine headaches.  BP readings 130-140's/60-70's. HR low 60's,occasionally high 50's.  Negative for visual changes, chest pain, dyspnea, palpitation, claudication, focal weakness, or edema.  Lab Results  Component Value Date   CREATININE 0.88 04/08/2020   BUN 10 04/08/2020   NA 139 04/08/2020   K 4.6 04/08/2020   CL 102 04/08/2020   CO2 27 04/08/2020   Migraine headaches are milder and about once per month. No associated visual aura or vomiting. Occasionally nausea. + Photophobia.  He is exercising regularly. Swimming 3 times per week for about 30 min at least. He has lost wt. Diet is "good", avoids fried food, decreased red meat.  Review of Systems  Constitutional: Negative for activity change, appetite change, fatigue and fever.  HENT: Negative for nosebleeds and sore throat.   Respiratory: Negative for cough and wheezing.   Gastrointestinal: Negative for abdominal pain.       Negative for changes in bowel habits.  Genitourinary: Negative for decreased urine volume and hematuria.  Musculoskeletal: Negative for gait problem and myalgias.  Neurological: Negative for syncope and facial asymmetry.  Rest of ROS, see pertinent positives sand negatives in HPI  Current Outpatient Medications on File Prior to Visit  Medication Sig Dispense Refill  . Cholecalciferol (VITAMIN D3) 50 MCG (2000 UT) TABS Take 50 mcg by mouth 2 (two) times a week.    . citalopram (CELEXA) 20 MG tablet TAKE 1 TABLET BY MOUTH EVERY DAY 90 tablet 2  . Multiple Vitamins-Minerals (MULTIVITAMIN WITH MINERALS) tablet Take 1 tablet by mouth 2 (two) times a week.    . Omega-3 Fatty Acids (SUPER OMEGA 3 EPA/DHA PO) Take 100 mg by  mouth daily.    . pravastatin (PRAVACHOL) 20 MG tablet TAKE 1 TABLET BY MOUTH EVERY DAY 90 tablet 1   No current facility-administered medications on file prior to visit.   Past Medical History:  Diagnosis Date  . Frequent headaches   . GERD (gastroesophageal reflux disease)   . Hyperlipidemia   . Hypertension   . Migraines    No Known Allergies  Social History   Socioeconomic History  . Marital status: Married    Spouse name: Not on file  . Number of children: Not on file  . Years of education: Not on file  . Highest education level: Not on file  Occupational History  . Not on file  Tobacco Use  . Smoking status: Never Smoker  . Smokeless tobacco: Never Used  Vaping Use  . Vaping Use: Not on file  Substance and Sexual Activity  . Alcohol use: Not Currently  . Drug use: Never  . Sexual activity: Yes    Partners: Female  Other Topics Concern  . Not on file  Social History Narrative  . Not on file   Social Determinants of Health   Financial Resource Strain: Not on file  Food Insecurity: Not on file  Transportation Needs: Not on file  Physical Activity: Not on file  Stress: Not on file  Social Connections: Not on file   Vitals:   02/15/21 1421  BP: 138/80  Pulse: 66  Resp: 16  Temp: 98.5 F (36.9 C)  SpO2: 97%  Wt Readings from Last 3 Encounters:  02/15/21 255 lb 8 oz (115.9 kg)  04/08/20 267 lb (121.1 kg)  10/22/18 257 lb 6 oz (116.7 kg)   Body mass index is 35.64 kg/m.  Physical Exam Vitals and nursing note reviewed.  Constitutional:      General: He is not in acute distress.    Appearance: He is well-developed.  HENT:     Head: Normocephalic and atraumatic.     Mouth/Throat:     Mouth: Mucous membranes are moist.     Pharynx: Oropharynx is clear.  Eyes:     Conjunctiva/sclera: Conjunctivae normal.  Cardiovascular:     Rate and Rhythm: Normal rate and regular rhythm.     Pulses:          Dorsalis pedis pulses are 2+ on the right side  and 2+ on the left side.     Heart sounds: No murmur heard.   Pulmonary:     Effort: Pulmonary effort is normal. No respiratory distress.     Breath sounds: Normal breath sounds.  Abdominal:     Palpations: Abdomen is soft. There is no hepatomegaly or mass.     Tenderness: There is no abdominal tenderness.  Lymphadenopathy:     Cervical: No cervical adenopathy.  Skin:    General: Skin is warm.     Findings: No erythema or rash.  Neurological:     Mental Status: He is alert and oriented to person, place, and time.     Cranial Nerves: No cranial nerve deficit.     Gait: Gait normal.  Psychiatric:     Comments: Well groomed, good eye contact.   ASSESSMENT AND PLAN:  [name] was seen today for follow-up.  Orders Placed This Encounter  Procedures  . Basic metabolic panel    Migraine without aura and without status migrainosus, not intractable Problem is well controlled. Continue Propranolol ER 120 mg daily and Imitrex 100 mg daily as needed.  Hypertension, essential, benign BP is not adequately controlled, home SBP's 130-140's. Continue Propranolol ER 120 mg daily. We discussed other pharmacologic options as well as side effects, he agrees with adding Olmesartan 20 mg 1/2 tab daily. If BP is still > 130/80 in 3-4 weeks he can increase dose of Olmesartan from 10 mg to 20 mg. BMP in 4-5 days. Continue monitoring BP at home. Low salt diet.  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 Encouraged to continue consistency with healthy diet and physical activity recommended.   Return in about 4 months (around 06/18/2021), or CPE, for Lab on 02/19/21.Marland Kitchen   Day Deery G. [place], MD  [facility]. [place] office.  A few things to remember from today's visit:   Hypertension, essential, benign - Plan: olmesartan (BENICAR) 20 MG tablet, propranolol ER (INDERAL LA) 120 MG 24 hr capsule  Migraine  without aura and without status migrainosus, not intractable - Plan: propranolol ER (INDERAL LA) 120 MG 24 hr capsule  If you need refills please call your pharmacy. Do not use My Chart to request refills or for acute issues that need immediate attention.   Today Olmesartan 10 mg added. Lab in 4-5 days. If blood pressure above 130/80 in 3-4 weeks,Olmesartan dose can be increased to 20 mg. No changes in propranolol.  Please be sure medication list is accurate. If a new problem present, please set up appointment sooner than planned today.

## 2021-02-15 NOTE — Assessment & Plan Note (Signed)
BP is not adequately controlled, home SBP's 130-140's. Continue Propranolol ER 120 mg daily. We discussed other pharmacologic options as well as side effects, he agrees with adding Olmesartan 20 mg 1/2 tab daily. If BP is still > 130/80 in 3-4 weeks he can increase dose of Olmesartan from 10 mg to 20 mg. BMP in 4-5 days. Continue monitoring BP at home. Low salt diet.

## 2021-02-15 NOTE — Assessment & Plan Note (Signed)
Problem is well controlled. Continue Propranolol ER 120 mg daily and Imitrex 100 mg daily as needed.

## 2021-02-19 ENCOUNTER — Other Ambulatory Visit: Payer: Self-pay

## 2021-02-19 ENCOUNTER — Other Ambulatory Visit (INDEPENDENT_AMBULATORY_CARE_PROVIDER_SITE_OTHER): Payer: BC Managed Care – PPO

## 2021-02-19 ENCOUNTER — Other Ambulatory Visit: Payer: BC Managed Care – PPO

## 2021-02-19 DIAGNOSIS — I10 Essential (primary) hypertension: Secondary | ICD-10-CM | POA: Diagnosis not present

## 2021-02-19 LAB — BASIC METABOLIC PANEL
BUN: 14 mg/dL (ref 6–23)
CO2: 29 mEq/L (ref 19–32)
Calcium: 9.4 mg/dL (ref 8.4–10.5)
Chloride: 102 mEq/L (ref 96–112)
Creatinine, Ser: 0.85 mg/dL (ref 0.40–1.50)
GFR: 109.53 mL/min (ref >60.00)
Glucose, Bld: 93 mg/dL (ref 70–99)
Potassium: 4.2 mEq/L (ref 3.5–5.1)
Sodium: 139 mEq/L (ref 135–145)

## 2021-04-19 ENCOUNTER — Encounter: Payer: Self-pay | Admitting: Family Medicine

## 2021-05-25 ENCOUNTER — Telehealth (INDEPENDENT_AMBULATORY_CARE_PROVIDER_SITE_OTHER): Payer: BC Managed Care – PPO | Admitting: Family Medicine

## 2021-05-25 ENCOUNTER — Encounter: Payer: Self-pay | Admitting: Family Medicine

## 2021-05-25 VITALS — Ht 71.0 in

## 2021-05-25 DIAGNOSIS — U071 COVID-19: Secondary | ICD-10-CM

## 2021-05-25 DIAGNOSIS — R059 Cough, unspecified: Secondary | ICD-10-CM | POA: Diagnosis not present

## 2021-05-25 MED ORDER — BENZONATATE 100 MG PO CAPS: 200.0000 mg | ORAL_CAPSULE | Freq: Two times a day (BID) | ORAL | 0 refills | Status: AC | PRN

## 2021-05-25 MED ORDER — NIRMATRELVIR/RITONAVIR (PAXLOVID)TABLET: 3.0000 | ORAL_TABLET | Freq: Two times a day (BID) | ORAL | 0 refills | Status: AC

## 2021-05-25 NOTE — Progress Notes (Signed)
Virtual Visit via Video Note I connected with Leon Mccullough on 05/25/21 by a video enabled telemedicine application and verified that I am speaking with the correct person using two identifiers.  Location patient: home Location provider:work office Persons participating in the virtual visit: patient, provider  I discussed the limitations of evaluation and management by telemedicine and the availability of in person appointments. The patient expressed understanding and agreed to proceed.  Chief Complaint  Patient presents with   Covid Positive   HPI: Leon Mccullough is a 40 yo male with history of hypertension, hyperlipidemia, migraine headaches, and anxiety c/o a days of respiratory symptoms and a positive home test COVID test last night. +Fatigue, chills, subjective fever, body aches, joint pain, sore throat, rhinorrhea, nasal congestion, and nonproductive cough. Chest wall pain with coughing episodes. Negative for anosmia and ageusia but last night he noted some olfactory changes, burning smell. Sore throat is better today. Nasal congestion and rhinorrhea worse in the morning.  Negative for wheezing, dyspnea, palpitation, abdominal pain, N/V changes in bowel habits, urinary symptoms, or a skin rash. He has taken acetaminophen. No known sick contact. His wife started with fatigue yesterday but feeling better today.She has not had COVID-19 test done yet.  COVID-19 vaccination completed + booster x1.  ROS: See pertinent positives and negatives per HPI.  Past Medical History:  Diagnosis Date   Frequent headaches    GERD (gastroesophageal reflux disease)    Hyperlipidemia    Hypertension    Migraines    Past Surgical History:  Procedure Laterality Date   TONSILLECTOMY AND ADENOIDECTOMY  2001   Family History  Problem Relation Age of Onset   Hyperlipidemia Mother    Hyperlipidemia Father    Cancer Maternal Grandfather    Heart attack Paternal Grandmother    Stroke Paternal  Grandfather    Social History   Socioeconomic History   Marital status: Married    Spouse name: Not on file   Number of children: Not on file   Years of education: Not on file   Highest education level: Not on file  Occupational History   Not on file  Tobacco Use   Smoking status: Never   Smokeless tobacco: Never  Vaping Use   Vaping Use: Not on file  Substance and Sexual Activity   Alcohol use: Not Currently   Drug use: Never   Sexual activity: Yes    Partners: Female  Other Topics Concern   Not on file  Social History Narrative   Not on file   Social Determinants of Health   Financial Resource Strain: Not on file  Food Insecurity: Not on file  Transportation Needs: Not on file  Physical Activity: Not on file  Stress: Not on file  Social Connections: Not on file  Intimate Partner Violence: Not on file   Current Outpatient Medications:    Cholecalciferol (VITAMIN D3) 50 MCG (2000 UT) TABS, Take 50 mcg by mouth 2 (two) times a week., Disp: , Rfl:    citalopram (CELEXA) 20 MG tablet, TAKE 1 TABLET BY MOUTH EVERY DAY, Disp: 90 tablet, Rfl: 2   Multiple Vitamins-Minerals (MULTIVITAMIN WITH MINERALS) tablet, Take 1 tablet by mouth 2 (two) times a week., Disp: , Rfl:    olmesartan (BENICAR) 20 MG tablet, Take 0.5 tablets (10 mg total) by mouth daily., Disp: 90 tablet, Rfl: 1   Omega-3 Fatty Acids (SUPER OMEGA 3 EPA/DHA PO), Take 100 mg by mouth daily., Disp: , Rfl:    pravastatin (PRAVACHOL)  20 MG tablet, TAKE 1 TABLET BY MOUTH EVERY DAY, Disp: 90 tablet, Rfl: 1   propranolol ER (INDERAL LA) 120 MG 24 hr capsule, Take 1 capsule (120 mg total) by mouth at bedtime., Disp: 90 capsule, Rfl: 2   SUMAtriptan (IMITREX) 100 MG tablet, Take 1 tablet (100 mg total) by mouth daily as needed for migraine (can repeat dose 2 hours after if needed.). May repeat in 2 hours if headache persists or recurs., Disp: 10 tablet, Rfl: 3  EXAM:  VITALS per patient if applicable:Ht 5\' 11"  (1.803 m)    BMI 35.64 kg/m   GENERAL: alert, oriented, appears well and in no acute distress  HEENT: atraumatic, conjunctiva clear, no obvious abnormalities on inspection of external nose and ears  NECK: normal movements of the head and neck  LUNGS: on inspection no signs of respiratory distress, breathing rate appears normal, no obvious gross SOB, gasping or wheezing. Occasional non productive cough during visit.  CV: no obvious cyanosis  MS: moves all visible extremities without noticeable abnormality  PSYCH/NEURO: pleasant and cooperative, no obvious depression or anxiety, speech and thought processing grossly intact  ASSESSMENT AND PLAN:  Discussed the following assessment and plan:  COVID-19 virus infection - Plan: nirmatrelvir/ritonavir EUA (PAXLOVID) 20 x 150 MG & 10 x 100MG  TABS We discussed Dx,possible complications and treatment options.  He has a mild to moderate case with moderate risk for complications (HTN and BMI). We discussed oral antiviral options and side effects. He agrees with trying Paxlovid.. Monitor temperature. Symptomatic treatment with plenty of fluids,rest, acetaminophen 500 mg 3-4 times per day prn. Throat lozenges if needed for sore throat. Flonase nasal spray to help with nasal congestion and rhinorrhea. Complete 7 days of quarantine ,starting from date of symptoms onset. Clearly instructed about warning signs.  Cough - Plan: benzonatate (TESSALON) 100 MG capsule Explained that cough and congestion may last a few more days and even weeks after acute symptoms have resolved.  We discussed possible serious and likely etiologies, options for evaluation and workup, limitations of telemedicine visit vs in person visit, treatment, treatment risks and precautions.  I discussed the assessment and treatment plan with the patient. He was provided an opportunity to ask questions and all were answered. He agreed with the plan and demonstrated an understanding of the  instructions.  Return if symptoms worsen or fail to improve.  Karel Mowers , MD

## 2021-05-28 ENCOUNTER — Ambulatory Visit: Payer: BC Managed Care – PPO | Admitting: Family Medicine

## 2021-06-04 NOTE — Progress Notes (Deleted)
HPI:  Mr. Leon Mccullough is a 40 y.o.male here today for his routine physical examination.  Last CPE: 04/08/20 He lives with with his wife.   Regular exercise 3 or more times per week: 3 to 4 weeks ago he started swimming every times per week. Following a healthy diet: He tries, in general he does follow a healthful diet.  He is cakes once per week and candy 2-3 times per week.   Chronic medical problems: Hypertension, migraine, anxiety, vitamin D deficiency, and hyperlipidemia among some.   Hx of STD's: Negative.  Immunization History  Administered Date(s) Administered   Influenza,inj,Quad PF,6+ Mos 07/25/2018   MMR 04/08/2020   Moderna Sars-Covid-2 Vaccination 12/19/2019, 01/21/2020   Tdap 10/22/2018   -Hep C screening: 04/08/20 NR  Last colon cancer screening: N/A Last prostate ca screening: N/A  Negative for high alcohol intake, tobacco use.  -Concerns and/or follow up today: *** 25 OH vit D 29 in 03/2020. *** Lab Results  Component Value Date   CHOL 160 04/08/2020   HDL 43 04/08/2020   LDLCALC 88 04/08/2020   TRIG 196 (H) 04/08/2020   CHOLHDL 3.7 04/08/2020    Review of Systems   Current Outpatient Medications on File Prior to Visit  Medication Sig Dispense Refill   benzonatate (TESSALON) 100 MG capsule Take 2 capsules (200 mg total) by mouth 2 (two) times daily as needed for up to 10 days. 30 capsule 0   Cholecalciferol (VITAMIN D3) 50 MCG (2000 UT) TABS Take 50 mcg by mouth 2 (two) times a week.     citalopram (CELEXA) 20 MG tablet TAKE 1 TABLET BY MOUTH EVERY DAY 90 tablet 2   Multiple Vitamins-Minerals (MULTIVITAMIN WITH MINERALS) tablet Take 1 tablet by mouth 2 (two) times a week.     olmesartan (BENICAR) 20 MG tablet Take 0.5 tablets (10 mg total) by mouth daily. 90 tablet 1   Omega-3 Fatty Acids (SUPER OMEGA 3 EPA/DHA PO) Take 100 mg by mouth daily.     pravastatin (PRAVACHOL) 20 MG tablet TAKE 1 TABLET BY MOUTH EVERY DAY 90 tablet 1   propranolol  ER (INDERAL LA) 120 MG 24 hr capsule Take 1 capsule (120 mg total) by mouth at bedtime. 90 capsule 2   SUMAtriptan (IMITREX) 100 MG tablet Take 1 tablet (100 mg total) by mouth daily as needed for migraine (can repeat dose 2 hours after if needed.). May repeat in 2 hours if headache persists or recurs. 10 tablet 3   No current facility-administered medications on file prior to visit.     Past Medical History:  Diagnosis Date   Frequent headaches    GERD (gastroesophageal reflux disease)    Hyperlipidemia    Hypertension    Migraines     Past Surgical History:  Procedure Laterality Date   TONSILLECTOMY AND ADENOIDECTOMY  2001    No Known Allergies  Family History  Problem Relation Age of Onset   Hyperlipidemia Mother    Hyperlipidemia Father    Cancer Maternal Grandfather    Heart attack Paternal Grandmother    Stroke Paternal Grandfather     Social History   Socioeconomic History   Marital status: Married    Spouse name: Not on file   Number of children: Not on file   Years of education: Not on file   Highest education level: Not on file  Occupational History   Not on file  Tobacco Use   Smoking status: Never   Smokeless tobacco: Never  Vaping Use   Vaping Use: Not on file  Substance and Sexual Activity   Alcohol use: Not Currently   Drug use: Never   Sexual activity: Yes    Partners: Female  Other Topics Concern   Not on file  Social History Narrative   Not on file   Social Determinants of Health   Financial Resource Strain: Not on file  Food Insecurity: Not on file  Transportation Needs: Not on file  Physical Activity: Not on file  Stress: Not on file  Social Connections: Not on file     There were no vitals filed for this visit. There is no height or weight on file to calculate BMI.   Wt Readings from Last 3 Encounters:  02/15/21 255 lb 8 oz (115.9 kg)  04/08/20 267 lb (121.1 kg)  10/22/18 257 lb 6 oz (116.7 kg)    Physical  Exam  ASSESSMENT AND PLAN:  There are no diagnoses linked to this encounter. No orders of the defined types were placed in this encounter.    No problem-specific Assessment & Plan notes found for this encounter.    No follow-ups on file.   Betty G. Martinique, MD  [facility]. [place] office.

## 2021-06-07 ENCOUNTER — Ambulatory Visit: Payer: BC Managed Care – PPO | Admitting: Family Medicine

## 2021-06-07 DIAGNOSIS — E559 Vitamin D deficiency, unspecified: Secondary | ICD-10-CM

## 2021-06-07 DIAGNOSIS — Z0000 Encounter for general adult medical examination without abnormal findings: Secondary | ICD-10-CM

## 2021-06-07 DIAGNOSIS — I10 Essential (primary) hypertension: Secondary | ICD-10-CM

## 2021-06-07 DIAGNOSIS — E785 Hyperlipidemia, unspecified: Secondary | ICD-10-CM

## 2021-06-07 DIAGNOSIS — Z130 Encounter for screening for diseases of the blood and blood-forming organs and certain disorders involving the immune mechanism: Secondary | ICD-10-CM

## 2021-06-21 ENCOUNTER — Ambulatory Visit: Payer: BC Managed Care – PPO | Admitting: Family Medicine

## 2021-06-23 ENCOUNTER — Encounter: Payer: BC Managed Care – PPO | Admitting: Family Medicine

## 2021-06-30 ENCOUNTER — Other Ambulatory Visit: Payer: Self-pay

## 2021-06-30 ENCOUNTER — Ambulatory Visit (INDEPENDENT_AMBULATORY_CARE_PROVIDER_SITE_OTHER): Payer: BC Managed Care – PPO | Admitting: Family Medicine

## 2021-06-30 ENCOUNTER — Encounter: Payer: Self-pay | Admitting: Family Medicine

## 2021-06-30 VITALS — BP 128/80 | HR 60 | Resp 16 | Ht 71.0 in | Wt 261.0 lb

## 2021-06-30 DIAGNOSIS — E559 Vitamin D deficiency, unspecified: Secondary | ICD-10-CM | POA: Diagnosis not present

## 2021-06-30 DIAGNOSIS — E785 Hyperlipidemia, unspecified: Secondary | ICD-10-CM

## 2021-06-30 DIAGNOSIS — Z23 Encounter for immunization: Secondary | ICD-10-CM | POA: Diagnosis not present

## 2021-06-30 DIAGNOSIS — Z0000 Encounter for general adult medical examination without abnormal findings: Secondary | ICD-10-CM

## 2021-06-30 DIAGNOSIS — Z1329 Encounter for screening for other suspected endocrine disorder: Secondary | ICD-10-CM | POA: Diagnosis not present

## 2021-06-30 DIAGNOSIS — Z130 Encounter for screening for diseases of the blood and blood-forming organs and certain disorders involving the immune mechanism: Secondary | ICD-10-CM

## 2021-06-30 DIAGNOSIS — I10 Essential (primary) hypertension: Secondary | ICD-10-CM

## 2021-06-30 DIAGNOSIS — Z13228 Encounter for screening for other metabolic disorders: Secondary | ICD-10-CM | POA: Diagnosis not present

## 2021-06-30 LAB — VITAMIN D 25 HYDROXY (VIT D DEFICIENCY, FRACTURES): VITD: 28.7 ng/mL — ABNORMAL LOW (ref 30.00–100.00)

## 2021-06-30 LAB — COMPREHENSIVE METABOLIC PANEL
ALT: 32 U/L (ref 0–53)
AST: 29 U/L (ref 0–37)
Albumin: 4.5 g/dL (ref 3.5–5.2)
Alkaline Phosphatase: 38 U/L — ABNORMAL LOW (ref 39–117)
BUN: 13 mg/dL (ref 6–23)
CO2: 29 mEq/L (ref 19–32)
Calcium: 9.8 mg/dL (ref 8.4–10.5)
Chloride: 103 mEq/L (ref 96–112)
Creatinine, Ser: 0.88 mg/dL (ref 0.40–1.50)
GFR: 108.11 mL/min (ref >60.00)
Glucose, Bld: 92 mg/dL (ref 70–99)
Potassium: 4.2 mEq/L (ref 3.5–5.1)
Sodium: 138 mEq/L (ref 135–145)
Total Bilirubin: 0.5 mg/dL (ref 0.2–1.2)
Total Protein: 7.5 g/dL (ref 6.0–8.3)

## 2021-06-30 LAB — LIPID PANEL
Cholesterol: 140 mg/dL (ref 0–200)
HDL: 39.1 mg/dL (ref >39.00)
LDL Cholesterol: 70 mg/dL (ref 0–99)
NonHDL: 100.48
Total CHOL/HDL Ratio: 4
Triglycerides: 153 mg/dL — ABNORMAL HIGH (ref 0.0–149.0)
VLDL: 30.6 mg/dL (ref 0.0–40.0)

## 2021-06-30 LAB — HEMOGLOBIN A1C: Hgb A1c MFr Bld: 5.9 % (ref 4.6–6.5)

## 2021-06-30 MED ORDER — OLMESARTAN MEDOXOMIL 20 MG PO TABS: 20.0000 mg | ORAL_TABLET | Freq: Every day | ORAL | 2 refills | Status: DC

## 2021-06-30 NOTE — Patient Instructions (Addendum)
A few things to remember from today's visit:  Routine general medical examination at a health care facility  Vitamin D deficiency, unspecified - Plan: VITAMIN D 25 Hydroxy (Vit-D Deficiency, Fractures)  Hyperlipidemia, unspecified hyperlipidemia type - Plan: Lipid panel  Hypertension, essential, benign - Plan: olmesartan (BENICAR) 20 MG tablet  Screening for endocrine, metabolic and immunity disorder - Plan: Comprehensive metabolic panel, Hemoglobin A1c  If you need refills please call your pharmacy. Do not use My Chart to request refills or for acute issues that need immediate attention.   Please be sure medication list is accurate. If a new problem present, please set up appointment sooner than planned today.  Health Maintenance, Male Adopting a healthy lifestyle and getting preventive care are important in promoting health and wellness. Ask your health care provider about: The right schedule for you to have regular tests and exams. Things you can do on your own to prevent diseases and keep yourself healthy. What should I know about diet, weight, and exercise? Eat a healthy diet  Eat a diet that includes plenty of vegetables, fruits, low-fat dairy products, and lean protein. Do not eat a lot of foods that are high in solid fats, added sugars, or sodium. Maintain a healthy weight Body mass index (BMI) is a measurement that can be used to identify possible weight problems. It estimates body fat based on height and weight. Your health care provider can help determine your BMI and help you achieve or maintain a healthy weight. Get regular exercise Get regular exercise. This is one of the most important things you can do for your health. Most adults should: Exercise for at least 150 minutes each week. The exercise should increase your heart rate and make you sweat (moderate-intensity exercise). Do strengthening exercises at least twice a week. This is in addition to the  moderate-intensity exercise. Spend less time sitting. Even light physical activity can be beneficial. Watch cholesterol and blood lipids Have your blood tested for lipids and cholesterol at 40 years of age, then have this test every 5 years. You may need to have your cholesterol levels checked more often if: Your lipid or cholesterol levels are high. You are older than 40 years of age. You are at high risk for heart disease. What should I know about cancer screening? Many types of cancers can be detected early and may often be prevented. Depending on your health history and family history, you may need to have cancer screening at various ages. This may include screening for: Colorectal cancer. Prostate cancer. Skin cancer. Lung cancer. What should I know about heart disease, diabetes, and high blood pressure? Blood pressure and heart disease High blood pressure causes heart disease and increases the risk of stroke. This is more likely to develop in people who have high blood pressure readings, are of African descent, or are overweight. Talk with your health care provider about your target blood pressure readings. Have your blood pressure checked: Every 3-5 years if you are 23-15 years of age. Every year if you are 41 years old or older. If you are between the ages of 68 and 56 and are a current or former smoker, ask your health care provider if you should have a one-time screening for abdominal aortic aneurysm (AAA). Diabetes Have regular diabetes screenings. This checks your fasting blood sugar level. Have the screening done: Once every three years after age 17 if you are at a normal weight and have a low risk for diabetes. More often  and at a younger age if you are overweight or have a high risk for diabetes. What should I know about preventing infection? Hepatitis B If you have a higher risk for hepatitis B, you should be screened for this virus. Talk with your health care provider to  find out if you are at risk for hepatitis B infection. Hepatitis C Blood testing is recommended for: Everyone born from [birth year] through 1965. Anyone with known risk factors for hepatitis C. Sexually transmitted infections (STIs) You should be screened each year for STIs, including gonorrhea and chlamydia, if: You are sexually active and are younger than 40 years of age. You are older than 40 years of age and your health care provider tells you that you are at risk for this type of infection. Your sexual activity has changed since you were last screened, and you are at increased risk for chlamydia or gonorrhea. Ask your health care provider if you are at risk. Ask your health care provider about whether you are at high risk for HIV. Your health care provider may recommend a prescription medicine to help prevent HIV infection. If you choose to take medicine to prevent HIV, you should first get tested for HIV. You should then be tested every 3 months for as long as you are taking the medicine. Follow these instructions at home: Lifestyle Do not use any products that contain nicotine or tobacco, such as cigarettes, e-cigarettes, and chewing tobacco. If you need help quitting, ask your health care provider. Do not use street drugs. Do not share needles. Ask your health care provider for help if you need support or information about quitting drugs. Alcohol use Do not drink alcohol if your health care provider tells you not to drink. If you drink alcohol: Limit how much you have to 0-2 drinks a day. Be aware of how much alcohol is in your drink. In the [place] one drink equals one 12 oz bottle of beer (355 mL), one 5 oz glass of wine (148 mL), or one 1 oz glass of hard liquor (44 mL). General instructions Schedule regular health, dental, and eye exams. Stay current with your vaccines. Tell your health care provider if: You often feel depressed. You have ever been abused or do not feel safe at  home. Summary Adopting a healthy lifestyle and getting preventive care are important in promoting health and wellness. Follow your health care provider's instructions about healthy diet, exercising, and getting tested or screened for diseases. Follow your health care provider's instructions on monitoring your cholesterol and blood pressure. This information is not intended to replace advice given to you by your health care provider. Make sure you discuss any questions you have with your health care provider. Document Revised: 11/20/2020 Document Reviewed: 09/05/2018 Elsevier Patient Education  2022 [employer].

## 2021-06-30 NOTE — Progress Notes (Signed)
HPI:  Mr. Toua Stites is a 40 y.o.male here today for his routine physical examination.  Last CPE: 04/08/20. He lives with his wife.  Regular exercise 3 or more times per week: For the past month he has not been swimming regularly doe to work schedule. Following a healthy diet: Cooking at home, vegetables and fruits at night.   Chronic medical problems: HTN,GAD,vit D def,HLD,and migraine headache.  Immunization History  Administered Date(s) Administered   Influenza Inj Mdck Quad Pf 06/22/2020   Influenza,inj,Quad PF,6+ Mos 07/25/2018, 06/30/2021   MMR 04/08/2020   Moderna Sars-Covid-2 Vaccination 12/19/2019, 01/21/2020   Tdap 10/22/2018   -Hep C screening: 04/08/20 NR. Last colon cancer screening: N/A He had colonoscopy and EGD back home and normal. Last prostate ca screening: N/A  Negative for high alcohol intake or tobacco use.  -Concerns and/or follow up today:  HLD: He is on Pravastatin 20 mg daily and low fat diet. Lab Results  Component Value Date   CHOL 160 04/08/2020   HDL 43 04/08/2020   LDLCALC 88 04/08/2020   TRIG 196 (H) 04/08/2020   CHOLHDL 3.7 04/08/2020   HTN: He is on Benazepril 20 mg daily and Propranolol LA 120 mg daily, the latter one also to treat migraine headaches. BP readings at home have improved, < 140/90.  Lab Results  Component Value Date   CREATININE 0.85 02/19/2021   BUN 14 02/19/2021   NA 139 02/19/2021   K 4.2 02/19/2021   CL 102 02/19/2021   CO2 29 02/19/2021   Vit D 50,000 U weekly , which he started 1.5 months ago. Last 25 OH vit D was 26 in 03/2020.  Review of Systems  Constitutional:  Negative for activity change, appetite change, fatigue and fever.  HENT:  Negative for mouth sores, nosebleeds, sore throat, trouble swallowing and voice change.   Eyes:  Negative for redness and visual disturbance.  Respiratory:  Negative for cough, shortness of breath and wheezing.   Cardiovascular:  Negative for chest pain,  palpitations and leg swelling.  Gastrointestinal:  Negative for abdominal pain, blood in stool, nausea and vomiting.  Endocrine: Negative for cold intolerance, heat intolerance, polydipsia, polyphagia and polyuria.  Genitourinary:  Negative for decreased urine volume, dysuria, genital sores, hematuria and testicular pain.  Musculoskeletal:  Negative for arthralgias, back pain, joint swelling and myalgias.  Skin:  Negative for color change and rash.  Allergic/Immunologic: Negative for environmental allergies.  Neurological:  Negative for syncope, weakness and headaches.  Hematological:  Negative for adenopathy. Does not bruise/bleed easily.  Psychiatric/Behavioral:  Negative for confusion and hallucinations.   All other systems reviewed and are negative.  Current Outpatient Medications on File Prior to Visit  Medication Sig Dispense Refill   Cholecalciferol (VITAMIN D3) 50 MCG (2000 UT) TABS Take 50 mcg by mouth 2 (two) times a week.     citalopram (CELEXA) 20 MG tablet TAKE 1 TABLET BY MOUTH EVERY DAY 90 tablet 2   Multiple Vitamins-Minerals (MULTIVITAMIN WITH MINERALS) tablet Take 1 tablet by mouth 2 (two) times a week.     Omega-3 Fatty Acids (SUPER OMEGA 3 EPA/DHA PO) Take 100 mg by mouth daily.     pravastatin (PRAVACHOL) 20 MG tablet TAKE 1 TABLET BY MOUTH EVERY DAY 90 tablet 1   propranolol ER (INDERAL LA) 120 MG 24 hr capsule Take 1 capsule (120 mg total) by mouth at bedtime. 90 capsule 2   SUMAtriptan (IMITREX) 100 MG tablet Take 1 tablet (100 mg total) by  mouth daily as needed for migraine (can repeat dose 2 hours after if needed.). May repeat in 2 hours if headache persists or recurs. 10 tablet 3   No current facility-administered medications on file prior to visit.   Past Medical History:  Diagnosis Date   Frequent headaches    GERD (gastroesophageal reflux disease)    Hyperlipidemia    Hypertension    Migraines    Past Surgical History:  Procedure Laterality Date    TONSILLECTOMY AND ADENOIDECTOMY  2001   No Known Allergies  Family History  Problem Relation Age of Onset   Hyperlipidemia Mother    Hyperlipidemia Father    Cancer Maternal Grandfather    Heart attack Paternal Grandmother    Stroke Paternal Grandfather    Social History   Socioeconomic History   Marital status: Married    Spouse name: Not on file   Number of children: Not on file   Years of education: Not on file   Highest education level: Not on file  Occupational History   Not on file  Tobacco Use   Smoking status: Never   Smokeless tobacco: Never  Vaping Use   Vaping Use: Not on file  Substance and Sexual Activity   Alcohol use: Not Currently   Drug use: Never   Sexual activity: Yes    Partners: Female  Other Topics Concern   Not on file  Social History Narrative   Not on file   Social Determinants of Health   Financial Resource Strain: Not on file  Food Insecurity: Not on file  Transportation Needs: Not on file  Physical Activity: Not on file  Stress: Not on file  Social Connections: Not on file   Vitals:   06/30/21 1103  BP: 128/80  Pulse: 60  Resp: 16  SpO2: 99%   Body mass index is 36.4 kg/m.  Wt Readings from Last 3 Encounters:  06/30/21 261 lb (118.4 kg)  02/15/21 255 lb 8 oz (115.9 kg)  04/08/20 267 lb (121.1 kg)   Physical Exam Vitals and nursing note reviewed.  Constitutional:      General: He is not in acute distress.    Appearance: He is well-developed.  HENT:     Head: Normocephalic and atraumatic.     Right Ear: Tympanic membrane, ear canal and external ear normal.     Left Ear: Tympanic membrane, ear canal and external ear normal.  Eyes:     Conjunctiva/sclera: Conjunctivae normal.     Pupils: Pupils are equal, round, and reactive to light.  Neck:     Thyroid: No thyromegaly.     Trachea: No tracheal deviation.  Cardiovascular:     Rate and Rhythm: Normal rate and regular rhythm.     Pulses:          Dorsalis pedis  pulses are 2+ on the right side and 2+ on the left side.     Heart sounds: No murmur heard. Pulmonary:     Effort: Pulmonary effort is normal. No respiratory distress.     Breath sounds: Normal breath sounds.  Abdominal:     Palpations: Abdomen is soft. There is no hepatomegaly or mass.     Tenderness: There is no abdominal tenderness.  Genitourinary:    Comments: No concerns. Musculoskeletal:        General: No tenderness.     Cervical back: Normal range of motion.     Comments: No major deformities appreciated and no signs of synovitis.  Lymphadenopathy:  Cervical: No cervical adenopathy.     Upper Body:     Right upper body: No supraclavicular adenopathy.     Left upper body: No supraclavicular adenopathy.  Skin:    General: Skin is warm.     Findings: No erythema.  Neurological:     Mental Status: He is alert and oriented to person, place, and time.     Cranial Nerves: No cranial nerve deficit.     Sensory: No sensory deficit.     Coordination: Coordination normal.     Gait: Gait normal.     Deep Tendon Reflexes:     Reflex Scores:      Bicep reflexes are 2+ on the right side and 2+ on the left side.      Patellar reflexes are 2+ on the right side and 2+ on the left side.  ASSESSMENT AND PLAN:  Mr.Jahfari was seen today for annual exam.  Diagnoses and all orders for this visit: Orders Placed This Encounter  Procedures   Flu Vaccine QUAD 31moIM (Fluarix, Fluzone & Alfiuria Quad PF)   VITAMIN D 25 Hydroxy (Vit-D Deficiency, Fractures)   Comprehensive metabolic panel   Hemoglobin A1c   Lipid panel   Lab Results  Component Value Date   HGBA1C 5.9 06/30/2021   Lab Results  Component Value Date   CREATININE 0.88 06/30/2021   BUN 13 06/30/2021   NA 138 06/30/2021   K 4.2 06/30/2021   CL 103 06/30/2021   CO2 29 06/30/2021   Lab Results  Component Value Date   ALT 32 06/30/2021   AST 29 06/30/2021   ALKPHOS 38 (L) 06/30/2021   BILITOT 0.5 06/30/2021   Lab  Results  Component Value Date   CHOL 140 06/30/2021   HDL 39.10 06/30/2021   LDLCALC 70 06/30/2021   TRIG 153.0 (H) 06/30/2021   CHOLHDL 4 06/30/2021   Routine general medical examination at a health care facility We discussed the importance of regular physical activity and healthy diet for prevention of chronic illness and/or complications. Preventive guidelines reviewed. Vaccination up to date.  Next CPE in a year.  Vitamin D deficiency, unspecified Continue current dose of vit D, will adjust dose ,if needed,according to 25 OH vit D result.  Hyperlipidemia, unspecified hyperlipidemia type Continue Pravastatin 20 mg daily and low fat diet. Further recommendations according to FLP result.  Hypertension, essential, benign BP adequately controlled. No changes in current management.  -     olmesartan (BENICAR) 20 MG tablet; Take 1 tablet (20 mg total) by mouth daily.  Screening for endocrine, metabolic and immunity disorder -     Hemoglobin A1c -     Comprehensive metabolic panel  Need for influenza vaccination -     Flu Vaccine QUAD 698moM (Fluarix, Fluzone & Alfiuria Quad PF)   Return in 6 months (on 12/29/2021) for HTN.   Crecencio Kwiatek G. JoMartiniqueMD  [facility].

## 2021-07-01 MED ORDER — PRAVASTATIN SODIUM 20 MG PO TABS: 20.0000 mg | ORAL_TABLET | Freq: Every day | ORAL | 3 refills | Status: DC

## 2021-09-23 NOTE — Progress Notes (Signed)
Chief Complaint  Patient presents with   rib cage pain    X a month   HPI: [name] is a 40 y.o. male with hx of HTN,HLD,migraines,and anxiety here today complaining of about a month or RUQ/rib cage pain, points to RUQ. Sometimes radiated to right-sided back. He has not identified exacerbating or alleviating factors.  Dull like pain,5/10, intermittent, mainly at the end of the day. It is not aggravated by movement or food intake.  No associated fever, chills, night sweats,abnormal wt loss, cough,wheezing,CP,dyspnea,nausea,vomiting,or changes in bowel habits. No hx of trauma or unusual physical activity. Pain does not interfere with his daily activities. He has not noted rash on affected area nor numbness,tingling or burning.  It is stable. GERD, heartburn is well controlled as far as he takes OTC PPI.  Review of Systems  Constitutional:  Negative for activity change, appetite change and chills.  HENT:  Negative for mouth sores.   Genitourinary:  Negative for decreased urine volume, dysuria and hematuria.  Neurological:  Negative for syncope and weakness.  Hematological:  Negative for adenopathy. Does not bruise/bleed easily.  Rest see pertinent positives and negatives per HPI.  Current Outpatient Medications on File Prior to Visit  Medication Sig Dispense Refill   Multiple Vitamins-Minerals (MULTIVITAMIN WITH MINERALS) tablet Take 1 tablet by mouth 2 (two) times a week.     olmesartan (BENICAR) 20 MG tablet Take 1 tablet (20 mg total) by mouth daily. 90 tablet 2   Omega-3 Fatty Acids (SUPER OMEGA 3 EPA/DHA PO) Take 100 mg by mouth daily.     pravastatin (PRAVACHOL) 20 MG tablet Take 1 tablet (20 mg total) by mouth daily. 90 tablet 3   propranolol ER (INDERAL LA) 120 MG 24 hr capsule Take 1 capsule (120 mg total) by mouth at bedtime. 90 capsule 2   No current facility-administered medications on file prior to visit.   Past Medical History:  Diagnosis Date    Frequent headaches    GERD (gastroesophageal reflux disease)    Hyperlipidemia    Hypertension    Migraines    No Known Allergies  Social History   Socioeconomic History   Marital status: Married    Spouse name: Not on file   Number of children: Not on file   Years of education: Not on file   Highest education level: Not on file  Occupational History   Not on file  Tobacco Use   Smoking status: Never   Smokeless tobacco: Never  Vaping Use   Vaping Use: Not on file  Substance and Sexual Activity   Alcohol use: Not Currently   Drug use: Never   Sexual activity: Yes    Partners: Female  Other Topics Concern   Not on file  Social History Narrative   Not on file   Social Determinants of Health   Financial Resource Strain: Not on file  Food Insecurity: Not on file  Transportation Needs: Not on file  Physical Activity: Not on file  Stress: Not on file  Social Connections: Not on file   Vitals:   09/24/21 1124  BP: 128/70  Pulse: (!) 54  Resp: 16  SpO2: 98%   Body mass index is 33.93 kg/m.  Physical Exam Vitals and nursing note reviewed.  Constitutional:      General: He is not in acute distress.    Appearance: He is well-developed.  HENT:     Head: Normocephalic and atraumatic.  Eyes:     Conjunctiva/sclera: Conjunctivae normal.  Cardiovascular:     Rate and Rhythm: Regular rhythm. Bradycardia present.     Heart sounds: No murmur heard. Pulmonary:     Effort: Pulmonary effort is normal. No respiratory distress.     Breath sounds: Normal breath sounds.  Chest:     Chest wall: Tenderness present. No crepitus.    Abdominal:     Palpations: Abdomen is soft. There is no hepatomegaly or mass.     Tenderness: There is no abdominal tenderness.  Lymphadenopathy:     Cervical: No cervical adenopathy.  Skin:    General: Skin is warm.     Findings: No erythema or rash.  Neurological:     Mental Status: He is alert and oriented to person, place, and time.      Cranial Nerves: No cranial nerve deficit.     Gait: Gait normal.  Psychiatric:     Comments: Well groomed, good eye contact.   ASSESSMENT AND PLAN:  [name] was seen today for rib cage pain.  Diagnoses and all orders for this visit:  Costal margin pain Local tenderness of 6-7th rib, hx does not suggest a serious process. Rib/chest X ray ordered today. After discussion of some side effect he agrees with short course of Celebrex, 7-10 days. Monitor for new symptoms.  Rib X ray: moderate amount of stool in colon, I do not appreciate rib fracture. Pending radiology report.  -     celecoxib (CELEBREX) 100 MG capsule; Take 1 capsule (100 mg total) by mouth 2 (two) times daily for 10 days.  RUQ abdominal pain Not elicited on examination today. We discussed possible etiologies. Not the typical presentation for cholelithiasis. ? Musculoskeletal. RUQ [place] will be arranged. Instructed about warning signs. I do not think labs are needed today.  Return if symptoms worsen or fail to improve.  Medard Decuir G. [place], MD  [facility]. [place] office.

## 2021-09-24 ENCOUNTER — Encounter: Payer: Self-pay | Admitting: Family Medicine

## 2021-09-24 ENCOUNTER — Other Ambulatory Visit: Payer: Self-pay

## 2021-09-24 ENCOUNTER — Ambulatory Visit: Payer: BC Managed Care – PPO | Admitting: Family Medicine

## 2021-09-24 ENCOUNTER — Ambulatory Visit (INDEPENDENT_AMBULATORY_CARE_PROVIDER_SITE_OTHER): Payer: BC Managed Care – PPO

## 2021-09-24 VITALS — BP 128/70 | HR 54 | Resp 16 | Ht 71.0 in | Wt 243.2 lb

## 2021-09-24 DIAGNOSIS — R0781 Pleurodynia: Secondary | ICD-10-CM

## 2021-09-24 DIAGNOSIS — R1011 Right upper quadrant pain: Secondary | ICD-10-CM | POA: Diagnosis not present

## 2021-09-24 MED ORDER — CELECOXIB 100 MG PO CAPS: 100.0000 mg | ORAL_CAPSULE | Freq: Two times a day (BID) | ORAL | 0 refills | Status: AC

## 2021-09-24 NOTE — Patient Instructions (Addendum)
A few things to remember from today's visit:  Costal margin pain - Plan: DG Ribs Unilateral W/Chest Right, celecoxib (CELEBREX) 100 MG capsule  RUQ abdominal pain - Plan: US Abdomen Limited RUQ (LIVER/GB)  If you need refills please call your pharmacy. Do not use My Chart to request refills or for acute issues that need immediate attention.   ? Musculoskeletal pain. Celebrex for 7-10 days. Monitor blood pressure.  Please be sure medication list is accurate. If a new problem present, please set up appointment sooner than planned today.

## 2021-10-27 ENCOUNTER — Ambulatory Visit
Admission: RE | Admit: 2021-10-27 | Discharge: 2021-10-27 | Disposition: A | Discharge status: Home / Self Care | Payer: BC Managed Care – PPO | Source: Ambulatory Visit | Attending: Family Medicine | Admitting: Family Medicine

## 2021-10-27 DIAGNOSIS — R1011 Right upper quadrant pain: Secondary | ICD-10-CM

## 2021-11-09 ENCOUNTER — Encounter: Payer: Self-pay | Admitting: Family Medicine

## 2021-11-09 ENCOUNTER — Other Ambulatory Visit: Payer: Self-pay | Admitting: Family Medicine

## 2021-11-09 MED ORDER — PROPRANOLOL HCL ER 80 MG PO CP24: 80.0000 mg | ORAL_CAPSULE | Freq: Every day | ORAL | 1 refills | Status: DC

## 2021-12-01 ENCOUNTER — Other Ambulatory Visit: Payer: Self-pay | Admitting: Family Medicine

## 2022-03-28 ENCOUNTER — Other Ambulatory Visit: Payer: Self-pay | Admitting: Family Medicine

## 2022-03-28 DIAGNOSIS — I10 Essential (primary) hypertension: Secondary | ICD-10-CM

## 2022-04-04 ENCOUNTER — Encounter: Payer: Self-pay | Admitting: Family Medicine

## 2022-04-04 ENCOUNTER — Telehealth: Payer: Self-pay | Admitting: Family Medicine

## 2022-04-04 MED ORDER — OLMESARTAN MEDOXOMIL 20 MG PO TABS
20.0000 mg | ORAL_TABLET | Freq: Every day | ORAL | 1 refills | Status: DC
Start: 2022-04-04 — End: 2022-12-21

## 2022-04-04 MED ORDER — OLMESARTAN MEDOXOMIL 40 MG PO TABS: 40.0000 mg | ORAL_TABLET | Freq: Every day | ORAL | 1 refills | Status: DC

## 2022-04-04 NOTE — Telephone Encounter (Signed)
Rx sent in & I left patient a voicemail letting him know.

## 2022-04-04 NOTE — Telephone Encounter (Signed)
Pt states dosage olmesartan (BENICAR) 20 MG tablet for was increased and now he needs a refill. Pharmacy wouldn't fill when he requested a refill. Requests a call. Pt has 1 pill left and when he is without he has bad headaches

## 2022-05-04 NOTE — Progress Notes (Unsigned)
    ACUTE VISIT Chief Complaint  Patient presents with   rib pain    Right side   HPI: [name] is a 41 y.o. male, who is here today complaining of *** HPI    09/24/2021   11:34 AM 06/30/2021    5:35 PM 02/15/2021    9:29 PM 04/08/2020    1:47 PM  Depression screen PHQ 2/9  Decreased Interest 1 0 0 0  Down, Depressed, Hopeless 1 0 0 0  PHQ - 2 Score 2 0 0 0  Altered sleeping 0     Tired, decreased energy 0     Change in appetite 0     Feeling bad or failure about yourself  1     Trouble concentrating 0     Moving slowly or fidgety/restless 0     Suicidal thoughts 0     PHQ-9 Score 3     Difficult doing work/chores Not difficult at all       Review of Systems Rest see pertinent positives and negatives per HPI.  Current Outpatient Medications on File Prior to Visit  Medication Sig Dispense Refill   Multiple Vitamins-Minerals (MULTIVITAMIN WITH MINERALS) tablet Take 1 tablet by mouth 2 (two) times a week.     olmesartan (BENICAR) 20 MG tablet Take 1 tablet (20 mg total) by mouth daily. 90 tablet 1   Omega-3 Fatty Acids (SUPER OMEGA 3 EPA/DHA PO) Take 100 mg by mouth daily.     pravastatin (PRAVACHOL) 20 MG tablet Take 1 tablet (20 mg total) by mouth daily. 90 tablet 3   propranolol ER (INDERAL LA) 80 MG 24 hr capsule TAKE 1 CAPSULE BY MOUTH EVERY DAY 90 capsule 2   No current facility-administered medications on file prior to visit.     Past Medical History:  Diagnosis Date   Frequent headaches    GERD (gastroesophageal reflux disease)    Hyperlipidemia    Hypertension    Migraines    No Known Allergies  Social History   Socioeconomic History   Marital status: Married    Spouse name: Not on file   Number of children: Not on file   Years of education: Not on file   Highest education level: Not on file  Occupational History   Not on file  Tobacco Use   Smoking status: Never   Smokeless tobacco: Never  Vaping Use   Vaping Use: Not on file   Substance and Sexual Activity   Alcohol use: Not Currently   Drug use: Never   Sexual activity: Yes    Partners: Female  Other Topics Concern   Not on file  Social History Narrative   Not on file   Social Determinants of Health   Financial Resource Strain: Not on file  Food Insecurity: Not on file  Transportation Needs: Not on file  Physical Activity: Not on file  Stress: Not on file  Social Connections: Not on file    Vitals:   05/06/22 1604  BP: 120/64  Pulse: 62  SpO2: 98%   Body mass index is 31.71 kg/m.  Physical Exam  ASSESSMENT AND PLAN:  There are no diagnoses linked to this encounter.   No follow-ups on file.   Armanie Ullmer G. [place], MD  [facility]. [place] office.  Discharge Instructions   None

## 2022-05-06 ENCOUNTER — Ambulatory Visit: Payer: BC Managed Care – PPO | Admitting: Family Medicine

## 2022-05-06 ENCOUNTER — Encounter: Payer: Self-pay | Admitting: Family Medicine

## 2022-05-06 VITALS — BP 120/64 | HR 62 | Resp 12 | Ht 71.0 in | Wt 227.4 lb

## 2022-05-06 DIAGNOSIS — K219 Gastro-esophageal reflux disease without esophagitis: Secondary | ICD-10-CM

## 2022-05-06 DIAGNOSIS — R0781 Pleurodynia: Secondary | ICD-10-CM

## 2022-05-06 DIAGNOSIS — F320 Major depressive disorder, single episode, mild: Secondary | ICD-10-CM

## 2022-05-06 DIAGNOSIS — R1010 Upper abdominal pain, unspecified: Secondary | ICD-10-CM

## 2022-05-06 DIAGNOSIS — I10 Essential (primary) hypertension: Secondary | ICD-10-CM

## 2022-05-06 DIAGNOSIS — F411 Generalized anxiety disorder: Secondary | ICD-10-CM

## 2022-05-06 MED ORDER — SERTRALINE HCL 50 MG PO TABS: 50.0000 mg | ORAL_TABLET | Freq: Every day | ORAL | 3 refills | Status: DC

## 2022-05-06 MED ORDER — PANTOPRAZOLE SODIUM 40 MG PO TBEC: 40.0000 mg | DELAYED_RELEASE_TABLET | Freq: Every day | ORAL | 0 refills | Status: DC

## 2022-05-06 NOTE — Patient Instructions (Addendum)
A few things to remember from today's visit:  Hypertension, essential, benign  Costal margin pain - Plan: CT Abdomen Pelvis W Contrast  Upper abdominal pain - Plan: Ambulatory referral to Gastroenterology, CT Abdomen Pelvis W Contrast  Gastroesophageal reflux disease, unspecified whether esophagitis present - Plan: pantoprazole (PROTONIX) 40 MG tablet, Ambulatory referral to Gastroenterology  GAD (generalized anxiety disorder) - Plan: sertraline (ZOLOFT) 50 MG tablet  If you need refills please call your pharmacy. Do not use My Chart to request refills or for acute issues that need immediate attention.   Protonix 40 mg daily 30 min before breakfast. Sertraline 1/2 tab for 10 days then 1 tab.  Please be sure medication list is accurate. If a new problem present, please set up appointment sooner than planned today.

## 2022-05-07 ENCOUNTER — Encounter: Payer: Self-pay | Admitting: Family Medicine

## 2022-05-10 ENCOUNTER — Telehealth: Payer: Self-pay | Admitting: Family Medicine

## 2022-05-10 ENCOUNTER — Ambulatory Visit (HOSPITAL_BASED_OUTPATIENT_CLINIC_OR_DEPARTMENT_OTHER): Admission: RE | Admit: 2022-05-10 | Payer: BC Managed Care – PPO | Source: Ambulatory Visit

## 2022-05-10 NOTE — Telephone Encounter (Signed)
Pt called to say he went to get his CAT scan and was told prior authorization was never received, so he was unable to have it done.  Pt stated they also tried to charge him a facility fee. Pt is requesting a new referral for another location, where they will not charge him out of pocket.  Pt has BC/BS.  Also, Pt stated MD said she would give him a referral to a GI. Pt would like that information as well.  Please advise.  [phone]

## 2022-05-10 NOTE — Telephone Encounter (Signed)
Referral had two insurances listed. Prior Berkley Harvey was done and patient is scheduled for August 21st at [facility].

## 2022-05-16 ENCOUNTER — Ambulatory Visit (HOSPITAL_BASED_OUTPATIENT_CLINIC_OR_DEPARTMENT_OTHER): Payer: BC Managed Care – PPO

## 2022-06-06 ENCOUNTER — Encounter: Payer: Self-pay | Admitting: Family Medicine

## 2022-06-27 ENCOUNTER — Other Ambulatory Visit: Payer: Self-pay | Admitting: Family Medicine

## 2022-06-27 DIAGNOSIS — K219 Gastro-esophageal reflux disease without esophagitis: Secondary | ICD-10-CM

## 2022-06-27 DIAGNOSIS — E785 Hyperlipidemia, unspecified: Secondary | ICD-10-CM

## 2022-08-01 ENCOUNTER — Other Ambulatory Visit: Payer: Self-pay

## 2022-08-01 DIAGNOSIS — F411 Generalized anxiety disorder: Secondary | ICD-10-CM

## 2022-08-01 MED ORDER — SERTRALINE HCL 50 MG PO TABS: 50.0000 mg | ORAL_TABLET | Freq: Every day | ORAL | 1 refills | Status: DC

## 2022-10-12 IMAGING — US US ABDOMEN LIMITED
1 series · 14 of 25 positions shown · non-contrast
Comparison: None.

CLINICAL DATA: Right upper quadrant pain for 2 months.

EXAM:
ULTRASOUND ABDOMEN LIMITED RIGHT UPPER QUADRANT

[Series 1: us abdomen limited · 0.22mm/px · 14 of 48 slices shown]
[im 1/48]
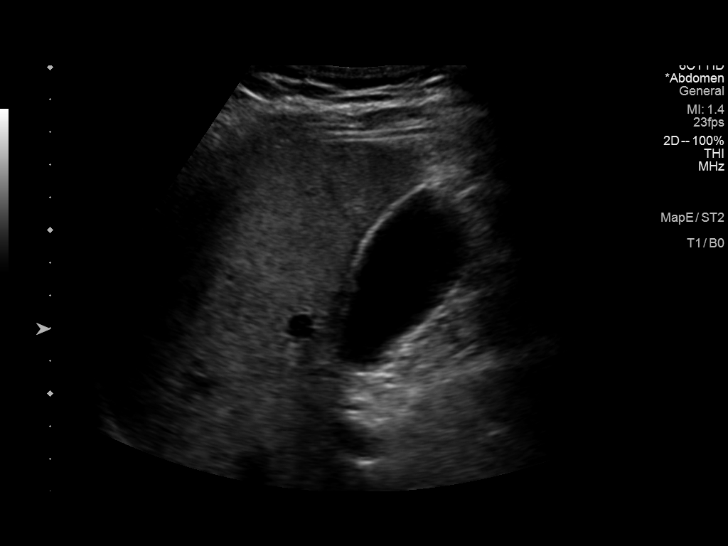
[im 4/48]
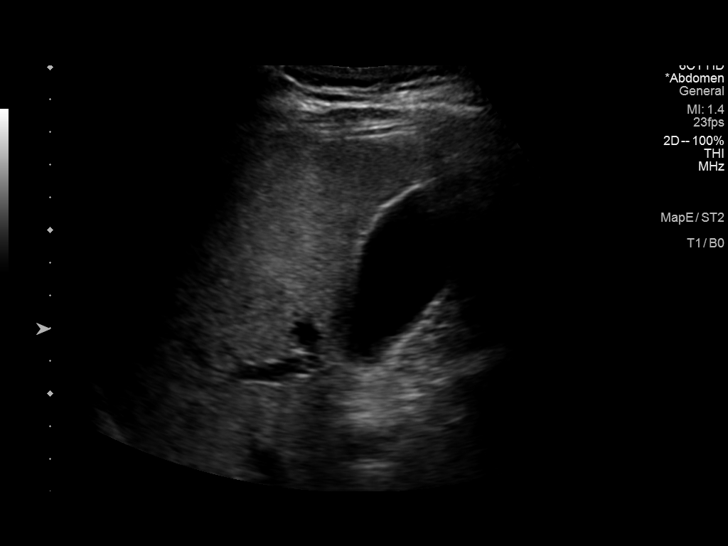
[im 8/48]
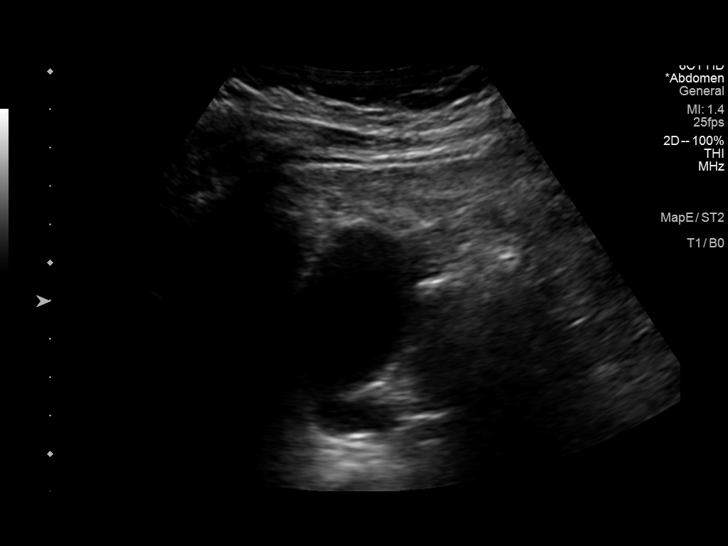
[im 12/48]
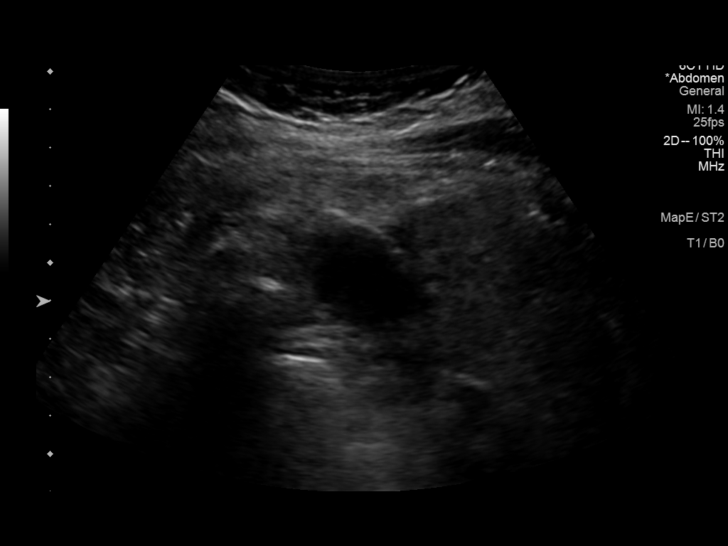
[im 16/48]
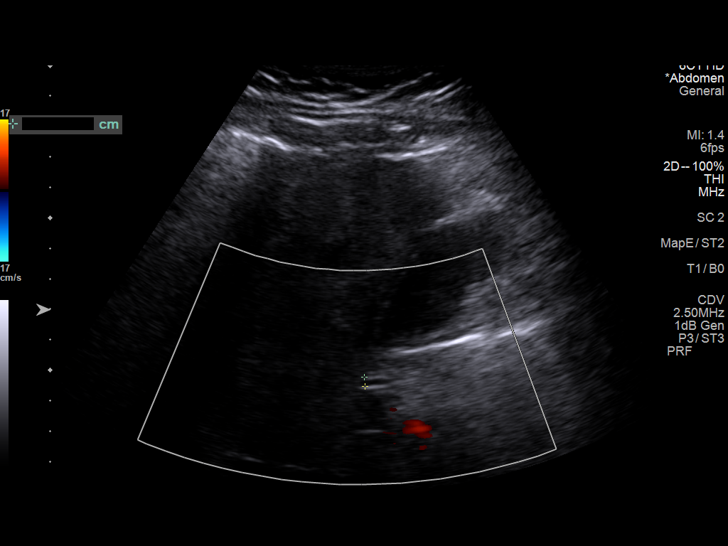
[im 18/48]
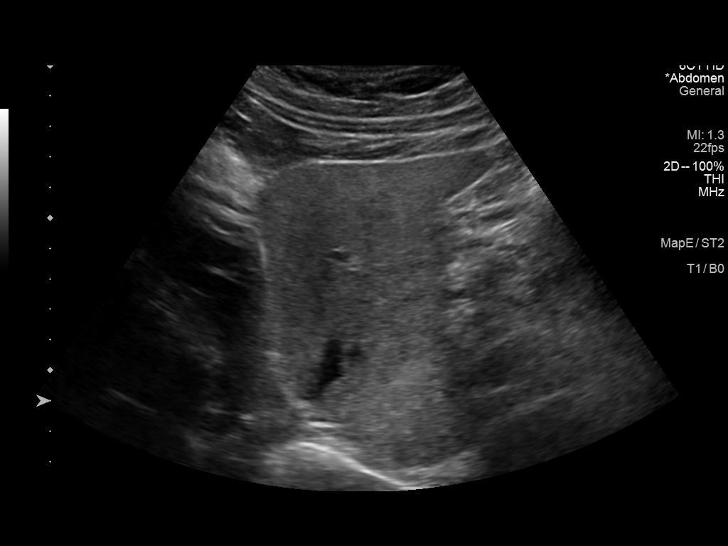
[im 22/48]
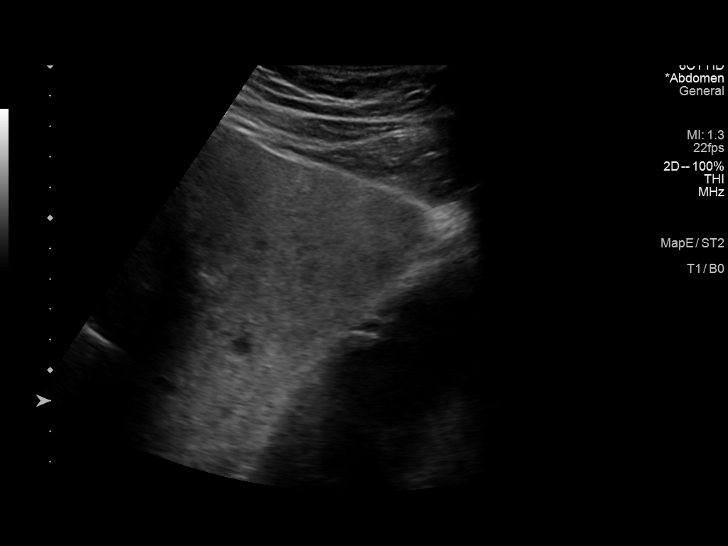
[im 26/48]
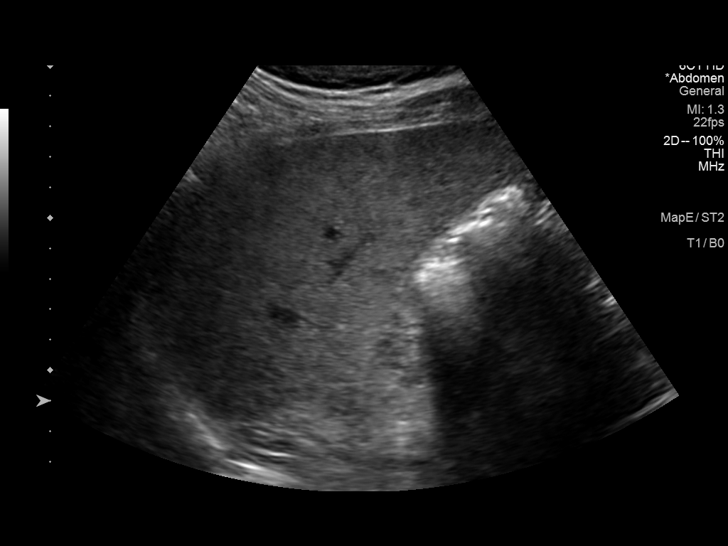
[im 30/48]
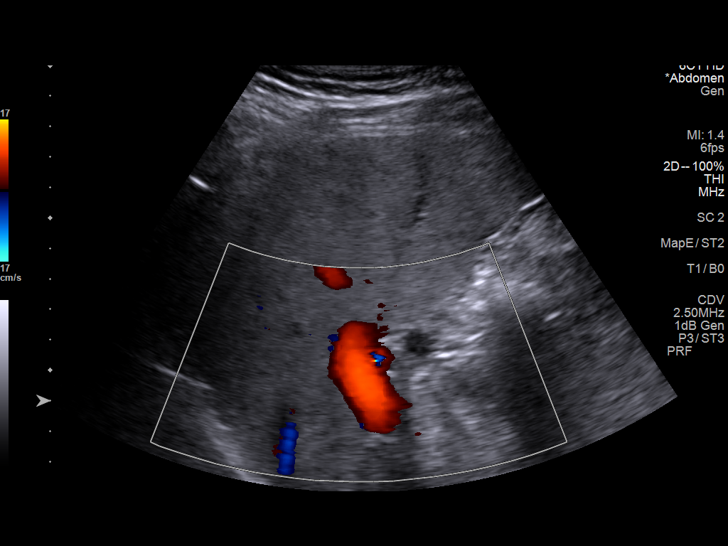
[im 32/48]
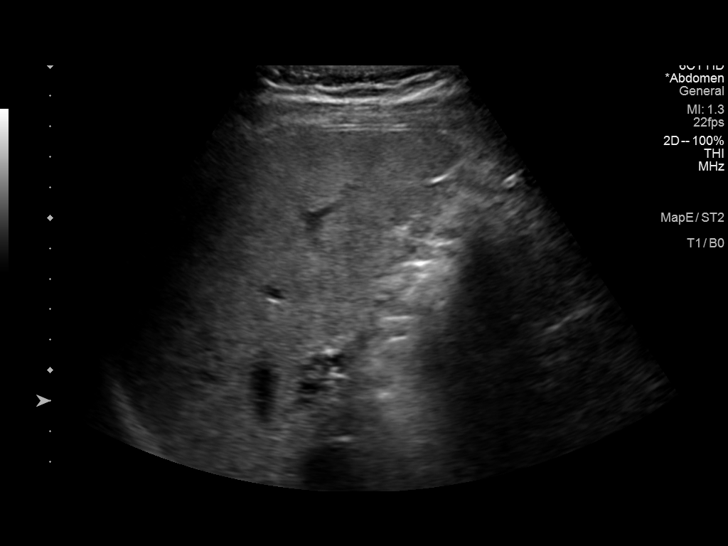
[im 36/48]
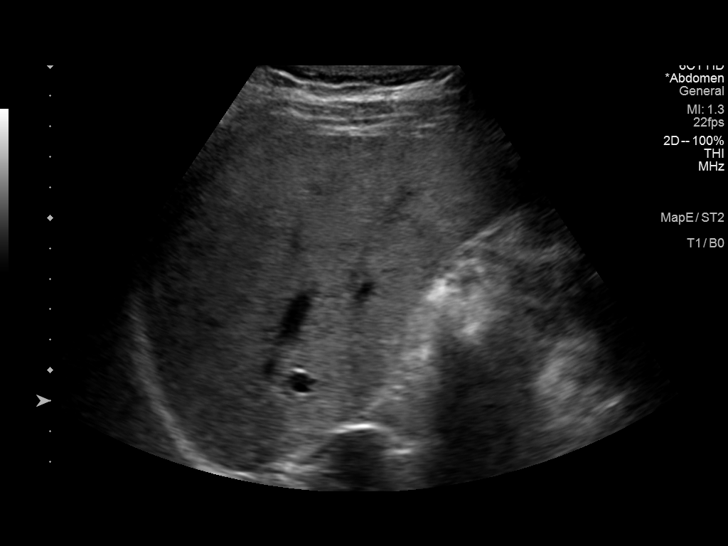
[im 40/48]
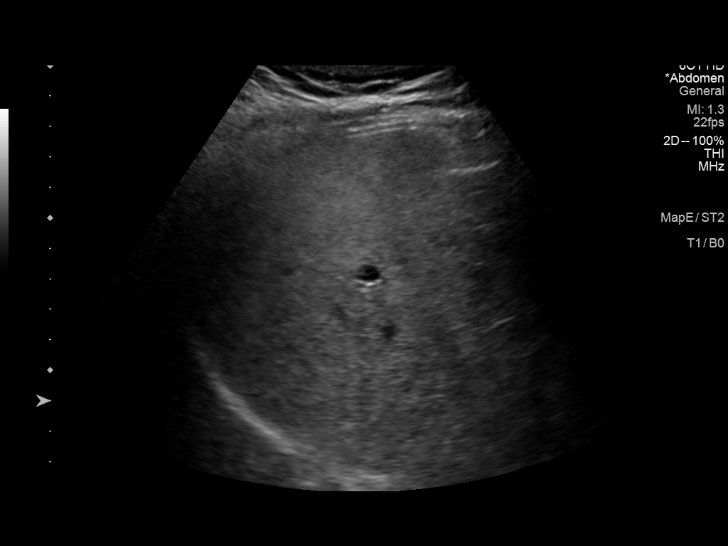
[im 44/48]
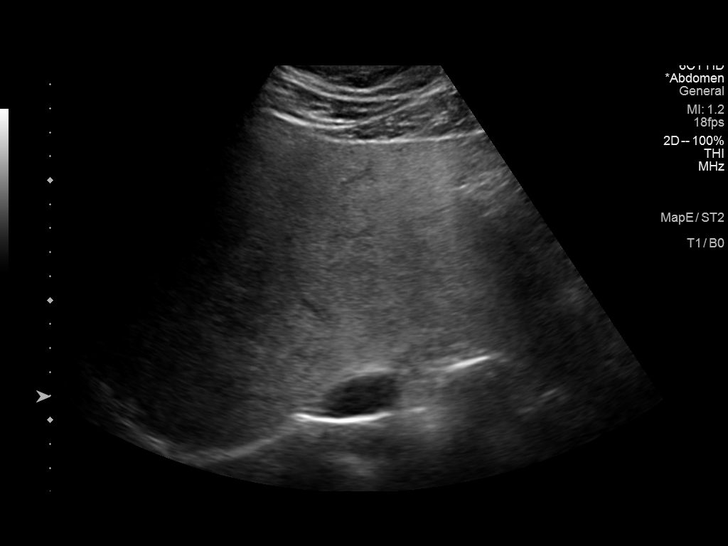
[im 48/48]
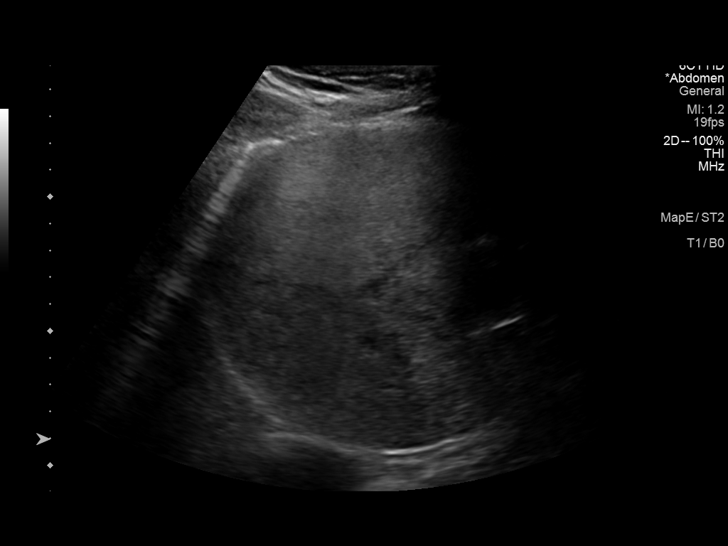

[14 of 25 positions shown; findings below may reference images not displayed]

FINDINGS: Gallbladder:

No gallstones or wall thickening visualized. No sonographic Murphy
sign noted by sonographer.

Common bile duct:

Diameter: 3 mm

Liver:

Increased echogenicity in the liver. No focal mass. Portal vein is
patent on color Doppler imaging with normal direction of blood flow
towards the liver.

Other: None.
IMPRESSION: 1. Increased echogenicity in the liver is identified without focal
mass. The findings are nonspecific but often due to hepatic
steatosis.
2. No other abnormalities identified.

## 2022-11-07 ENCOUNTER — Telehealth (INDEPENDENT_AMBULATORY_CARE_PROVIDER_SITE_OTHER): Payer: Self-pay | Admitting: Family Medicine

## 2022-11-07 ENCOUNTER — Encounter: Payer: Self-pay | Admitting: Family Medicine

## 2022-11-07 VITALS — Ht 71.0 in

## 2022-11-07 DIAGNOSIS — Z91199 Patient's noncompliance with other medical treatment and regimen due to unspecified reason: Secondary | ICD-10-CM

## 2022-11-07 NOTE — Progress Notes (Signed)
Virtual Visit via Video Note I connected with Leon Mccullough on 11/07/22 by a video enabled telemedicine application and verified that I am speaking with the correct person using two identifiers. Location patient: home Location provider:work or home office Persons participating in the virtual visit: patient, provider  I discussed the limitations of evaluation and management by telemedicine and the availability of in person appointments. The patient expressed understanding and agreed to proceed.  Chief Complaint  Patient presents with   Covid Positive    HPI:  ROS: See pertinent positives and negatives per HPI.  Past Medical History:  Diagnosis Date   Frequent headaches    GERD (gastroesophageal reflux disease)    Hyperlipidemia    Hypertension    Migraines     Past Surgical History:  Procedure Laterality Date   TONSILLECTOMY AND ADENOIDECTOMY  2001    Family History  Problem Relation Age of Onset   Hyperlipidemia Mother    Hyperlipidemia Father    Cancer Maternal Grandfather    Heart attack Paternal Grandmother    Stroke Paternal Grandfather     Social History   Socioeconomic History   Marital status: Married    Spouse name: Not on file   Number of children: Not on file   Years of education: Not on file   Highest education level: Not on file  Occupational History   Not on file  Tobacco Use   Smoking status: Never   Smokeless tobacco: Never  Vaping Use   Vaping Use: Not on file  Substance and Sexual Activity   Alcohol use: Not Currently   Drug use: Never   Sexual activity: Yes    Partners: Female  Other Topics Concern   Not on file  Social History Narrative   Not on file   Social Determinants of Health   Financial Resource Strain: Not on file  Food Insecurity: Not on file  Transportation Needs: Not on file  Physical Activity: Not on file  Stress: Not on file  Social Connections: Not on file  Intimate Partner Violence: Not on file      Current Outpatient Medications:    Multiple Vitamins-Minerals (MULTIVITAMIN WITH MINERALS) tablet, Take 1 tablet by mouth 2 (two) times a week., Disp: , Rfl:    olmesartan (BENICAR) 20 MG tablet, Take 1 tablet (20 mg total) by mouth daily., Disp: 90 tablet, Rfl: 1   Omega-3 Fatty Acids (SUPER OMEGA 3 EPA/DHA PO), Take 100 mg by mouth daily., Disp: , Rfl:    pantoprazole (PROTONIX) 40 MG tablet, TAKE 1 TABLET BY MOUTH EVERY DAY, Disp: 90 tablet, Rfl: 2   pravastatin (PRAVACHOL) 20 MG tablet, TAKE 1 TABLET BY MOUTH EVERY DAY, Disp: 90 tablet, Rfl: 2   propranolol ER (INDERAL LA) 80 MG 24 hr capsule, TAKE 1 CAPSULE BY MOUTH EVERY DAY, Disp: 90 capsule, Rfl: 2   sertraline (ZOLOFT) 50 MG tablet, Take 1 tablet (50 mg total) by mouth daily., Disp: 90 tablet, Rfl: 1  EXAM:  VITALS per patient if applicable:  GENERAL: alert, oriented, appears well and in no acute distress  HEENT: atraumatic, conjunctiva clear, no obvious abnormalities on inspection of external nose and ears  NECK: normal movements of the head and neck  LUNGS: on inspection no signs of respiratory distress, breathing rate appears normal, no obvious gross SOB, gasping or wheezing  CV: no obvious cyanosis  MS: moves all visible extremities without noticeable abnormality  PSYCH/NEURO: pleasant and cooperative, no obvious depression or anxiety, speech and thought  processing grossly intact  ASSESSMENT AND PLAN:  Discussed the following assessment and plan:  No diagnosis found.  There are no diagnoses linked to this encounter.  We discussed possible serious and likely etiologies, options for evaluation and workup, limitations of telemedicine visit vs in person visit, treatment, treatment risks and precautions. The patient was advised to call back or seek an in-person evaluation if the symptoms worsen or if the condition fails to improve as anticipated. I discussed the assessment and treatment plan with the patient. The  patient was provided an opportunity to ask questions and all were answered. The patient agreed with the plan and demonstrated an understanding of the instructions.  No follow-ups on file.

## 2022-12-20 NOTE — Progress Notes (Unsigned)
HPI: [name] is a 42 y.o. male, who is here today with his wife for chronic disease management.  Last seen on 05/06/22. No new problems since his last visit.  Hypertension: Currently on Propranolol ER 80 mg daily (also for migraine headaches) and Olmesartan 20 mg daily. Home BP readings 130's/70-80's.  Swimming 2-3 times per week.  Negative for unusual or severe headache, visual changes, exertional chest pain, dyspnea,  focal weakness, or edema. Has noted HR 40's 50's, occasional lightheadedness. Migraine headache are not frequent, no new associated symptoms.  Lab Results  Component Value Date   CREATININE 0.88 06/30/2021   BUN 13 06/30/2021   NA 138 06/30/2021   K 4.2 06/30/2021   CL 103 06/30/2021   CO2 29 06/30/2021   HLD: He is on Pravastatin 20 mg daily and low fat diet. Lab Results  Component Value Date   CHOL 140 06/30/2021   HDL 39.10 06/30/2021   LDLCALC 70 06/30/2021   TRIG 153.0 (H) 06/30/2021   CHOLHDL 4 06/30/2021   Vit D deficiency: He is on Vit D 5000 U daily. Last 25 OH vit D was 28.7.  Depression and anxiety: He is on Sertraline 50 mg daily. Medication is still helping. Sleeping about 7-8 hours.     12/21/2022    7:13 AM 05/06/2022    4:56 PM 09/24/2021   11:34 AM 06/30/2021    5:35 PM 02/15/2021    9:29 PM  Depression screen PHQ 2/9  Decreased Interest 0 2 1 0 0  Down, Depressed, Hopeless 0 3 1 0 0  PHQ - 2 Score 0 5 2 0 0  Altered sleeping 0 1 0    Tired, decreased energy 0 1 0    Change in appetite 0 0 0    Feeling bad or failure about yourself  0 3 1    Trouble concentrating 0 0 0    Moving slowly or fidgety/restless 0 0 0    Suicidal thoughts 0 0 0    PHQ-9 Score 0 10 3    Difficult doing work/chores Not difficult at all Somewhat difficult Not difficult at all     Prediabetes: HgA1C has been elevated at 5.9 in 06/2021.  Review of Systems  Constitutional:  Negative for activity change, appetite change and fever.  HENT:   Negative for nosebleeds and sore throat.   Respiratory:  Negative for cough and wheezing.   Gastrointestinal:  Negative for abdominal pain, nausea and vomiting.  Genitourinary:  Negative for decreased urine volume, dysuria and hematuria.  Skin:  Negative for rash.  Neurological:  Negative for syncope and facial asymmetry.  Psychiatric/Behavioral:  Negative for confusion and hallucinations.   See other pertinent positives and negatives in HPI.  Current Outpatient Medications on File Prior to Visit  Medication Sig Dispense Refill   Multiple Vitamins-Minerals (MULTIVITAMIN WITH MINERALS) tablet Take 1 tablet by mouth 2 (two) times a week.     Omega-3 Fatty Acids (SUPER OMEGA 3 EPA/DHA PO) Take 100 mg by mouth daily.     pantoprazole (PROTONIX) 40 MG tablet TAKE 1 TABLET BY MOUTH EVERY DAY 90 tablet 2   pravastatin (PRAVACHOL) 20 MG tablet TAKE 1 TABLET BY MOUTH EVERY DAY 90 tablet 2   sertraline (ZOLOFT) 50 MG tablet Take 1 tablet (50 mg total) by mouth daily. 90 tablet 1   No current facility-administered medications on file prior to visit.   Past Medical History:  Diagnosis Date   Frequent headaches  GERD (gastroesophageal reflux disease)    Hyperlipidemia    Hypertension    Migraines    No Known Allergies  Social History   Socioeconomic History   Marital status: Married    Spouse name: Not on file   Number of children: Not on file   Years of education: Not on file   Highest education level: Not on file  Occupational History   Not on file  Tobacco Use   Smoking status: Never   Smokeless tobacco: Never  Vaping Use   Vaping Use: Not on file  Substance and Sexual Activity   Alcohol use: Not Currently   Drug use: Never   Sexual activity: Yes    Partners: Female  Other Topics Concern   Not on file  Social History Narrative   Not on file   Social Determinants of Health   Financial Resource Strain: Not on file  Food Insecurity: Not on file  Transportation Needs:  Not on file  Physical Activity: Not on file  Stress: Not on file  Social Connections: Not on file   Vitals:   12/21/22 0703  BP: 126/80  Pulse: 60  Resp: 12  Temp: 98.1 F (36.7 C)  SpO2: 98%   Wt Readings from Last 3 Encounters:  12/21/22 231 lb 4 oz (104.9 kg)  05/06/22 227 lb 6 oz (103.1 kg)  09/24/21 243 lb 4 oz (110.3 kg)   Body mass index is 32.25 kg/m.  Physical Exam Vitals and nursing note reviewed.  Constitutional:      General: He is not in acute distress.    Appearance: He is well-developed.  HENT:     Head: Normocephalic and atraumatic.     Mouth/Throat:     Mouth: Mucous membranes are moist.     Pharynx: Oropharynx is clear.  Eyes:     Conjunctiva/sclera: Conjunctivae normal.  Cardiovascular:     Rate and Rhythm: Normal rate and regular rhythm.     Pulses:          Dorsalis pedis pulses are 2+ on the right side and 2+ on the left side.     Heart sounds: No murmur heard. Pulmonary:     Effort: Pulmonary effort is normal. No respiratory distress.     Breath sounds: Normal breath sounds.  Abdominal:     Palpations: Abdomen is soft. There is no hepatomegaly or mass.     Tenderness: There is no abdominal tenderness.  Lymphadenopathy:     Cervical: No cervical adenopathy.  Skin:    General: Skin is warm.     Findings: No erythema or rash.  Neurological:     Mental Status: He is alert and oriented to person, place, and time.     Cranial Nerves: No cranial nerve deficit.     Gait: Gait normal.  Psychiatric:        Mood and Affect: Mood and affect normal.   ASSESSMENT AND PLAN:  [name] was seen today for medical management of chronic issues.  Diagnoses and all orders for this visit: Lab Results  Component Value Date   CREATININE 0.99 12/21/2022   BUN 13 12/21/2022   NA 139 12/21/2022   K 4.0 12/21/2022   CL 103 12/21/2022   CO2 27 12/21/2022   Lab Results  Component Value Date   CHOL 167 12/21/2022   HDL 51.90 12/21/2022   LDLCALC 88  12/21/2022   TRIG 139.0 12/21/2022   CHOLHDL 3 12/21/2022   Lab Results  Component Value  Date   ALT 61 (H) 12/21/2022   AST 42 (H) 12/21/2022   ALKPHOS 50 12/21/2022   BILITOT 0.6 12/21/2022   Lab Results  Component Value Date   HGBA1C 5.8 12/21/2022   Hyperlipidemia, unspecified hyperlipidemia type Assessment & Plan: Continue Pravastatin 20 mg daily and low fat diet. Further recommendations according to lipid panel result.  Orders: -     Comprehensive metabolic panel; Future -     Lipid panel; Future  Vitamin D deficiency, unspecified Assessment & Plan: Continue Vit D 5000 U daily. Further recommendations will be given according to 25 OH vit D result.  Orders: -     VITAMIN D 25 Hydroxy (Vit-D Deficiency, Fractures); Future  Hypertension, essential, benign Assessment & Plan: Some DBP readings at home mildly elevated. Because episodes of bradycardia, propranolol ER dose decreased from 80 mg to 60 mg daily. Olmesartan increased from 20 mg to 40 mg. Continue low salt diet. Continue monitoring BP regularly and will let me know about BP readings in 3-4 weeks. F/U in 6 months.  Orders: -     Propranolol HCl ER; Take 1 capsule (60 mg total) by mouth daily.  Dispense: 90 capsule; Refill: 1 -     Olmesartan Medoxomil; Take 1 tablet (40 mg total) by mouth daily.  Dispense: 90 tablet; Refill: 1 -     Comprehensive metabolic panel; Future -     TSH; Future  Prediabetes Assessment & Plan: Continue a healthy life style for diabetes prevention. Further recommendations according to HgA1C.  Orders: -     Hemoglobin A1c; Future  Migraine without aura and without status migrainosus, not intractable Assessment & Plan: Problem is well controlled. Because episodes of bradycardia, Propranolol ER decreased from 80 mg to 60 mg daily.  Orders: -     Propranolol HCl ER; Take 1 capsule (60 mg total) by mouth daily.  Dispense: 90 capsule; Refill: 1  GAD (generalized anxiety  disorder) Assessment & Plan: In general well controlled. Continue Sertraline 50 mg daily.   Class 1 obesity with serious comorbidity and body mass index (BMI) of 32.0 to 32.9 in adult, unspecified obesity type Assessment & Plan: He understands the benefits of wt loss as well as adverse effects of obesity. Consistency with healthy diet and physical activity encouraged.   Return in about 6 months (around 06/23/2023) for chronic problems.  Ja Pistole G. Martin[place], MD  [facility]. [place] office.

## 2022-12-21 ENCOUNTER — Ambulatory Visit: Payer: BC Managed Care – PPO | Admitting: Family Medicine

## 2022-12-21 ENCOUNTER — Encounter: Payer: Self-pay | Admitting: Family Medicine

## 2022-12-21 VITALS — BP 126/80 | HR 60 | Temp 98.1°F | Resp 12 | Ht 71.0 in | Wt 231.2 lb

## 2022-12-21 DIAGNOSIS — F411 Generalized anxiety disorder: Secondary | ICD-10-CM

## 2022-12-21 DIAGNOSIS — R7303 Prediabetes: Secondary | ICD-10-CM | POA: Diagnosis not present

## 2022-12-21 DIAGNOSIS — E559 Vitamin D deficiency, unspecified: Secondary | ICD-10-CM

## 2022-12-21 DIAGNOSIS — I10 Essential (primary) hypertension: Secondary | ICD-10-CM | POA: Diagnosis not present

## 2022-12-21 DIAGNOSIS — E785 Hyperlipidemia, unspecified: Secondary | ICD-10-CM | POA: Diagnosis not present

## 2022-12-21 DIAGNOSIS — R7401 Elevation of levels of liver transaminase levels: Secondary | ICD-10-CM

## 2022-12-21 DIAGNOSIS — G43009 Migraine without aura, not intractable, without status migrainosus: Secondary | ICD-10-CM

## 2022-12-21 DIAGNOSIS — E669 Obesity, unspecified: Secondary | ICD-10-CM

## 2022-12-21 DIAGNOSIS — F320 Major depressive disorder, single episode, mild: Secondary | ICD-10-CM | POA: Insufficient documentation

## 2022-12-21 DIAGNOSIS — Z6832 Body mass index (BMI) 32.0-32.9, adult: Secondary | ICD-10-CM

## 2022-12-21 LAB — LIPID PANEL
Cholesterol: 167 mg/dL (ref 0–200)
HDL: 51.9 mg/dL (ref >39.00)
LDL Cholesterol: 88 mg/dL (ref 0–99)
NonHDL: 115.56
Total CHOL/HDL Ratio: 3
Triglycerides: 139 mg/dL (ref 0.0–149.0)
VLDL: 27.8 mg/dL (ref 0.0–40.0)

## 2022-12-21 LAB — COMPREHENSIVE METABOLIC PANEL
ALT: 61 U/L — ABNORMAL HIGH (ref 0–53)
AST: 42 U/L — ABNORMAL HIGH (ref 0–37)
Albumin: 4.6 g/dL (ref 3.5–5.2)
Alkaline Phosphatase: 50 U/L (ref 39–117)
BUN: 13 mg/dL (ref 6–23)
CO2: 27 mEq/L (ref 19–32)
Calcium: 9.9 mg/dL (ref 8.4–10.5)
Chloride: 103 mEq/L (ref 96–112)
Creatinine, Ser: 0.99 mg/dL (ref 0.40–1.50)
GFR: 94.79 mL/min (ref >60.00)
Glucose, Bld: 97 mg/dL (ref 70–99)
Potassium: 4 mEq/L (ref 3.5–5.1)
Sodium: 139 mEq/L (ref 135–145)
Total Bilirubin: 0.6 mg/dL (ref 0.2–1.2)
Total Protein: 7.5 g/dL (ref 6.0–8.3)

## 2022-12-21 LAB — TSH: TSH: 2.88 u[IU]/mL (ref 0.35–5.50)

## 2022-12-21 LAB — VITAMIN D 25 HYDROXY (VIT D DEFICIENCY, FRACTURES): VITD: 46.2 ng/mL (ref 30.00–100.00)

## 2022-12-21 LAB — HEMOGLOBIN A1C: Hgb A1c MFr Bld: 5.8 % (ref 4.6–6.5)

## 2022-12-21 MED ORDER — OLMESARTAN MEDOXOMIL 40 MG PO TABS: 40.0000 mg | ORAL_TABLET | Freq: Every day | ORAL | 1 refills | Status: DC

## 2022-12-21 MED ORDER — PROPRANOLOL HCL ER 60 MG PO CP24: 60.0000 mg | ORAL_CAPSULE | Freq: Every day | ORAL | 1 refills | Status: DC

## 2022-12-21 NOTE — Assessment & Plan Note (Signed)
Continue Vit D 5000 U daily. Further recommendations will be given according to 25 OH vit D result.

## 2022-12-21 NOTE — Assessment & Plan Note (Signed)
He understands the benefits of wt loss as well as adverse effects of obesity. °Consistency with healthy diet and physical activity encouraged. °

## 2022-12-21 NOTE — Assessment & Plan Note (Signed)
In general well controlled. Continue Sertraline 50 mg daily.

## 2022-12-21 NOTE — Assessment & Plan Note (Signed)
Continue Pravastatin 20 mg daily and low fat diet. Further recommendations according to lipid panel result. 

## 2022-12-21 NOTE — Assessment & Plan Note (Signed)
Continue a healthy life style for diabetes prevention. Further recommendations according to HgA1C.

## 2022-12-21 NOTE — Assessment & Plan Note (Addendum)
Problem is well controlled. Because episodes of bradycardia, Propranolol ER decreased from 80 mg to 60 mg daily.

## 2022-12-21 NOTE — Assessment & Plan Note (Addendum)
Some DBP readings at home mildly elevated. Because episodes of bradycardia, propranolol ER dose decreased from 80 mg to 60 mg daily. Olmesartan increased from 20 mg to 40 mg. Continue low salt diet. Continue monitoring BP regularly and will let me know about BP readings in 3-4 weeks. F/U in 6 months.

## 2022-12-21 NOTE — Patient Instructions (Addendum)
A few things to remember from today's visit:  Hyperlipidemia, unspecified hyperlipidemia type - Plan: Comprehensive metabolic panel, Lipid panel  Vitamin D deficiency, unspecified - Plan: VITAMIN D 25 Hydroxy (Vit-D Deficiency, Fractures)  Hypertension, essential, benign - Plan: propranolol ER (INDERAL [place]) 60 MG 24 hr capsule, olmesartan (BENICAR) 40 MG tablet, Comprehensive metabolic panel, TSH  Depression, major, single episode, mild (HCC)  Prediabetes - Plan: Hemoglobin A1c  Migraine without aura and without status migrainosus, not intractable - Plan: propranolol ER (INDERAL [place]) 60 MG 24 hr capsule  Propranolol decreased. Olmesartan increased to 40 mg daily. Check blood pressure and let me know about readings in about 4-6 weeks.  If you need refills for medications you take chronically, please call your pharmacy. Do not use My Chart to request refills or for acute issues that need immediate attention. If you send a my chart message, it may take a few days to be addressed, specially if I am not in the office.  Please be sure medication list is accurate. If a new problem present, please set up appointment sooner than planned today.

## 2022-12-24 ENCOUNTER — Other Ambulatory Visit: Payer: Self-pay | Admitting: Family Medicine

## 2022-12-24 DIAGNOSIS — F411 Generalized anxiety disorder: Secondary | ICD-10-CM

## 2023-01-16 ENCOUNTER — Encounter: Payer: Self-pay | Admitting: Family Medicine

## 2023-01-16 ENCOUNTER — Other Ambulatory Visit: Payer: Self-pay | Admitting: Family Medicine

## 2023-01-16 DIAGNOSIS — N469 Male infertility, unspecified: Secondary | ICD-10-CM

## 2023-02-20 ENCOUNTER — Other Ambulatory Visit: Payer: Self-pay | Admitting: Family Medicine

## 2023-02-20 DIAGNOSIS — K219 Gastro-esophageal reflux disease without esophagitis: Secondary | ICD-10-CM

## 2023-02-20 DIAGNOSIS — E785 Hyperlipidemia, unspecified: Secondary | ICD-10-CM

## 2023-04-10 ENCOUNTER — Other Ambulatory Visit: Payer: Self-pay | Admitting: Family Medicine

## 2023-04-10 DIAGNOSIS — G43009 Migraine without aura, not intractable, without status migrainosus: Secondary | ICD-10-CM

## 2023-04-10 DIAGNOSIS — I10 Essential (primary) hypertension: Secondary | ICD-10-CM

## 2023-06-09 ENCOUNTER — Ambulatory Visit (INDEPENDENT_AMBULATORY_CARE_PROVIDER_SITE_OTHER): Payer: BC Managed Care – PPO

## 2023-06-09 DIAGNOSIS — Z23 Encounter for immunization: Secondary | ICD-10-CM

## 2023-07-02 ENCOUNTER — Other Ambulatory Visit: Payer: Self-pay | Admitting: Family Medicine

## 2023-07-02 DIAGNOSIS — I10 Essential (primary) hypertension: Secondary | ICD-10-CM

## 2023-12-26 ENCOUNTER — Other Ambulatory Visit: Payer: Self-pay | Admitting: Family Medicine

## 2023-12-26 DIAGNOSIS — F411 Generalized anxiety disorder: Secondary | ICD-10-CM

## 2023-12-27 ENCOUNTER — Other Ambulatory Visit: Payer: Self-pay | Admitting: Family Medicine

## 2023-12-27 DIAGNOSIS — I10 Essential (primary) hypertension: Secondary | ICD-10-CM

## 2023-12-27 DIAGNOSIS — G43009 Migraine without aura, not intractable, without status migrainosus: Secondary | ICD-10-CM

## 2024-01-24 ENCOUNTER — Other Ambulatory Visit: Payer: Self-pay | Admitting: Family Medicine

## 2024-01-24 DIAGNOSIS — I10 Essential (primary) hypertension: Secondary | ICD-10-CM

## 2024-01-24 DIAGNOSIS — G43009 Migraine without aura, not intractable, without status migrainosus: Secondary | ICD-10-CM

## 2024-01-26 ENCOUNTER — Other Ambulatory Visit: Payer: Self-pay | Admitting: Family Medicine

## 2024-01-26 DIAGNOSIS — I10 Essential (primary) hypertension: Secondary | ICD-10-CM

## 2024-01-26 DIAGNOSIS — E785 Hyperlipidemia, unspecified: Secondary | ICD-10-CM

## 2024-01-26 NOTE — Telephone Encounter (Signed)
 Copied from CRM [id number]. Topic: Clinical - Medication Refill >> Jan 26, 2024  1:28 PM Juluis Ok wrote: Most Recent Primary Care Visit:  Provider: [facility]  Department: [facility]  Visit Type: FLU SHOT  Date: 06/09/2023  Medication: olmesartan  (BENICAR ) 40 MG tablet, 90 day supply  Has the patient contacted their pharmacy? Yes (Agent: If no, request that the patient contact the pharmacy for the refill. If patient does not wish to contact the pharmacy document the reason why and proceed with request.) (Agent: If yes, when and what did the pharmacy advise?)  Is this the correct pharmacy for this prescription? Yes If no, delete pharmacy and type the correct one.  This is the patient's preferred pharmacy:    [id number] - [place], [place] - [address] [place] [address] Phone: [phone] Fax: [phone]   Has the prescription been filled recently? No  Is the patient out of the medication? Yes  Has the patient been seen for an appointment in the last year OR does the patient have an upcoming appointment? Yes  Can we respond through MyChart? Yes  Agent: Please be advised that Rx refills may take up to 3 business days. We ask that you follow-up with your pharmacy.

## 2024-02-05 ENCOUNTER — Other Ambulatory Visit: Payer: Self-pay | Admitting: Family Medicine

## 2024-02-05 ENCOUNTER — Encounter: Payer: Self-pay | Admitting: Family Medicine

## 2024-02-05 DIAGNOSIS — G43009 Migraine without aura, not intractable, without status migrainosus: Secondary | ICD-10-CM

## 2024-02-05 DIAGNOSIS — F411 Generalized anxiety disorder: Secondary | ICD-10-CM

## 2024-02-05 DIAGNOSIS — E785 Hyperlipidemia, unspecified: Secondary | ICD-10-CM

## 2024-02-05 DIAGNOSIS — I10 Essential (primary) hypertension: Secondary | ICD-10-CM

## 2024-02-05 MED ORDER — PROPRANOLOL HCL ER 60 MG PO CP24: 60.0000 mg | ORAL_CAPSULE | Freq: Every day | ORAL | 0 refills | Status: DC

## 2024-02-05 MED ORDER — PRAVASTATIN SODIUM 20 MG PO TABS
20.0000 mg | ORAL_TABLET | Freq: Every day | ORAL | 0 refills | Status: DC
Start: 2024-02-05 — End: 2024-05-10

## 2024-02-05 MED ORDER — SERTRALINE HCL 50 MG PO TABS: 50.0000 mg | ORAL_TABLET | Freq: Every day | ORAL | 0 refills | Status: DC

## 2024-02-05 MED ORDER — OLMESARTAN MEDOXOMIL 40 MG PO TABS
40.0000 mg | ORAL_TABLET | Freq: Every day | ORAL | 0 refills | Status: DC
Start: 2024-02-05 — End: 2024-05-06

## 2024-02-09 ENCOUNTER — Ambulatory Visit: Payer: Self-pay | Admitting: Family Medicine

## 2024-02-09 ENCOUNTER — Encounter: Payer: Self-pay | Admitting: Family Medicine

## 2024-02-09 ENCOUNTER — Telehealth: Admitting: Family Medicine

## 2024-02-09 VITALS — Ht 71.0 in

## 2024-02-09 DIAGNOSIS — R7303 Prediabetes: Secondary | ICD-10-CM | POA: Diagnosis not present

## 2024-02-09 DIAGNOSIS — I10 Essential (primary) hypertension: Secondary | ICD-10-CM

## 2024-02-09 DIAGNOSIS — E559 Vitamin D deficiency, unspecified: Secondary | ICD-10-CM

## 2024-02-09 DIAGNOSIS — F411 Generalized anxiety disorder: Secondary | ICD-10-CM

## 2024-02-09 DIAGNOSIS — M542 Cervicalgia: Secondary | ICD-10-CM

## 2024-02-09 DIAGNOSIS — K219 Gastro-esophageal reflux disease without esophagitis: Secondary | ICD-10-CM

## 2024-02-09 DIAGNOSIS — E785 Hyperlipidemia, unspecified: Secondary | ICD-10-CM

## 2024-02-09 MED ORDER — FAMOTIDINE 40 MG PO TABS: 40.0000 mg | ORAL_TABLET | Freq: Every evening | ORAL | 1 refills | Status: AC | PRN

## 2024-02-09 NOTE — Assessment & Plan Note (Signed)
 He reports having intermittent episodes of heartburn, he does not want to take pantoprazole  daily.  He agrees with changing to famotidine 40 mg at bedtime as needed. Continue GERD precautions.

## 2024-02-09 NOTE — Assessment & Plan Note (Addendum)
 Continue Vit D 5000 U 4 times per week. Further recommendations will be given according to 25 OH vit D result.

## 2024-02-09 NOTE — Assessment & Plan Note (Signed)
 Currently on sertraline  50 mg daily, which he feels is helping.  For now he would like to continue same treatment.

## 2024-02-09 NOTE — Progress Notes (Signed)
 Virtual Visit via Video Note I connected with Leon Mccullough on 02/09/2024 by a video enabled telemedicine application and verified that I am speaking with the correct person using two identifiers. Location patient: home Location provider:work office Persons participating in the virtual visit: patient, provider, scribe  I discussed the limitations of evaluation and management by telemedicine and the availability of in person appointments. The patient expressed understanding and agreed to proceed.  Chief Complaint  Patient presents with   Medical Management of Chronic Issues   HPI: Mr. Leon Mccullough is a 43 y.o. male with a PMHx significant for Migraine, HTN, vitamin D  deficiency, prediabetes, HLD, and GAD who is being seen on video today for follow up.  He was last seen on 12/21/2022.  Hypertension: Currently on olmesartan  40 mg daily and propranolol  ER 60 mg daily.  Patient has been checking his BP at home and says it can get as high as 140/75, but 90% of the time it is 120s/70-80.  Negative for unusual or severe headache, visual changes, exertional chest pain, dyspnea,  focal weakness, or edema.  Lab Results  Component Value Date   CREATININE 0.99 12/21/2022   BUN 13 12/21/2022   NA 139 12/21/2022   K 4.0 12/21/2022   CL 103 12/21/2022   CO2 27 12/21/2022  He is trying to avoid fats and salt in his diet.  Hyperlipidemia: Currently on pravastatin  20 mg daily.   He admits he has not exercised for 9 months due to his schedule.  Side effects from medication: none Lab Results  Component Value Date   CHOL 167 12/21/2022   HDL 51.90 12/21/2022   LDLCALC 88 12/21/2022   TRIG 139.0 12/21/2022   CHOLHDL 3 12/21/2022   Anxiety:  Currently on sertraline  50 mg daily.  He feels like medication is helping with dealing with stress, has tolerated well. He would like to continue taking the medication.   GERD:  He takes Pantoprazole  40 mg as needed. He says he most recently took  it about a month ago.  He reports having intermittent heartburn, when this happened he takes pantoprazole  daily for 2 weeks and discontinues once symptoms resolved.  Vitamin D  deficiency:  He takes 5000 units 4 times per week.  Lab Results  Component Value Date   VD25OH 46.20 12/21/2022   Prediabetes: Negative for polydipsia, polyuria, polyphagia. Lab Results  Component Value Date   HGBA1C 5.8 12/21/2022   Neck pain:  Patient complains of a constant crampy neck pain for about 5 months. When he moves his neck, he occasionally hears popping noises with some associated pain.  He rates the pain anywhere from 4-9/10.  He also has occasional night pains in his hands.   ROS: See pertinent positives and negatives per HPI.  Past Medical History:  Diagnosis Date   Frequent headaches    GERD (gastroesophageal reflux disease)    Hyperlipidemia    Hypertension    Migraines    Past Surgical History:  Procedure Laterality Date   TONSILLECTOMY AND ADENOIDECTOMY  2001   Family History  Problem Relation Age of Onset   Hyperlipidemia Mother    Hyperlipidemia Father    Cancer Maternal Grandfather    Heart attack Paternal Grandmother    Stroke Paternal Grandfather    Social History   Socioeconomic History   Marital status: Married    Spouse name: Not on file   Number of children: Not on file   Years of education: Not on file   Highest  education level: Doctorate  Occupational History   Not on file  Tobacco Use   Smoking status: Never   Smokeless tobacco: Never  Vaping Use   Vaping status: Not on file  Substance and Sexual Activity   Alcohol use: Not Currently   Drug use: Never   Sexual activity: Yes    Partners: Female  Other Topics Concern   Not on file  Social History Narrative   Not on file   Social Drivers of Health   Financial Resource Strain: Low Risk  (02/09/2024)   Overall Financial Resource Strain (CARDIA)    Difficulty of Paying Living Expenses: Not hard at  all  Food Insecurity: No Food Insecurity (02/09/2024)   Hunger Vital Sign    Worried About Running Out of Food in the Last Year: Never true    Ran Out of Food in the Last Year: Never true  Transportation Needs: No Transportation Needs (02/09/2024)   PRAPARE - Administrator, Civil Service (Medical): No    Lack of Transportation (Non-Medical): No  Physical Activity: Unknown (02/09/2024)   Exercise Vital Sign    Days of Exercise per Week: 0 days    Minutes of Exercise per Session: Not on file  Stress: No Stress Concern Present (02/09/2024)   [employer] of Occupational Health - Occupational Stress Questionnaire    Feeling of Stress : Only a little  Social Connections: Moderately Isolated (02/09/2024)   Social Connection and Isolation Panel [NHANES]    Frequency of Communication with Friends and Family: Twice a week    Frequency of Social Gatherings with Friends and Family: Once a week    Attends Religious Services: Never    Database administrator or Organizations: No    Attends Engineer, structural: Not on file    Marital Status: Married  Catering manager Violence: Not on file    Current Outpatient Medications:    famotidine (PEPCID) 40 MG tablet, Take 1 tablet (40 mg total) by mouth at bedtime as needed for heartburn or indigestion., Disp: 90 tablet, Rfl: 1   Multiple Vitamins-Minerals (MULTIVITAMIN WITH MINERALS) tablet, Take 1 tablet by mouth 2 (two) times a week., Disp: , Rfl:    olmesartan  (BENICAR ) 40 MG tablet, Take 1 tablet (40 mg total) by mouth daily. Follow up/physical needed, Disp: 90 tablet, Rfl: 0   Omega-3 Fatty Acids (SUPER OMEGA 3 EPA/DHA PO), Take 100 mg by mouth daily., Disp: , Rfl:    pravastatin  (PRAVACHOL ) 20 MG tablet, Take 1 tablet (20 mg total) by mouth daily. Due for follow up/physical, Disp: 90 tablet, Rfl: 0   propranolol  ER (INDERAL  [place]) 60 MG 24 hr capsule, Take 1 capsule (60 mg total) by mouth daily. Due for follow up/physical, Disp:  90 capsule, Rfl: 0   sertraline  (ZOLOFT ) 50 MG tablet, Take 1 tablet (50 mg total) by mouth daily. Due for follow up/physical, Disp: 90 tablet, Rfl: 0  EXAM:  VITALS per patient if applicable:Ht 5\' 11"  (1.803 m)   BMI 32.25 kg/m   GENERAL: alert, oriented, appears well and in no acute distress  HEENT: atraumatic, conjunctiva clear, no obvious abnormalities on inspection of external nose and ears  NECK: normal movements of the head and neck  LUNGS: on inspection no signs of respiratory distress, breathing rate appears normal, no obvious gross SOB, gasping or wheezing  CV: no obvious cyanosis  MS: moves all visible extremities without noticeable abnormality  PSYCH/NEURO: pleasant and cooperative, no obvious depression or anxiety,  speech and thought processing grossly intact  ASSESSMENT AND PLAN:  Discussed the following assessment and plan:  Prediabetes Assessment & Plan: Last hemoglobin A1c 5.8 in 11/2022. Encouraged consistency with a health lifestyle for diabetes prevention.  Orders: -     Hemoglobin A1c; Future  Gastroesophageal reflux disease, unspecified whether esophagitis present Assessment & Plan: He reports having intermittent episodes of heartburn, he does not want to take pantoprazole  daily.  He agrees with changing to famotidine 40 mg at bedtime as needed. Continue GERD precautions.  Orders: -     Famotidine; Take 1 tablet (40 mg total) by mouth at bedtime as needed for heartburn or indigestion.  Dispense: 90 tablet; Refill: 1  Vitamin D  deficiency, unspecified Assessment & Plan: Continue Vit D 5000 U 4 times per week. Further recommendations will be given according to 25 OH vit D result.  Orders: -     VITAMIN D  25 Hydroxy (Vit-D Deficiency, Fractures); Future  GAD (generalized anxiety disorder) Assessment & Plan: Currently on sertraline  50 mg daily, which he feels is helping.  For now he would like to continue same treatment.  Hyperlipidemia,  unspecified hyperlipidemia type Assessment & Plan: Continue Pravastatin  20 mg daily and low fat diet. He will call to arrange lab appt for FLP. Further recommendation will be given according to lipid panel results.  Orders: -     Comprehensive metabolic panel with GFR; Future -     Lipid panel; Future  Cervicalgia Problem has been going on for about 5 months. No history of trauma. History does not suggest a serious process. Cervical x-ray will be arranged. Monitor for new symptoms. Recommend topical IcyHot, local massage, and range of motion exercises. We could consider PT or consultation with chiropractor.  -     DG Cervical Spine Complete; Future  Hypertension, essential, benign Assessment & Plan: General BP has been adequately controlled. Continue olmesartan  40 mg daily and propranolol  ER 60 mg daily. Continue monitoring BP regularly. Continue low-salt diet. Follow-up in 6 months.  Orders: -     Comprehensive metabolic panel with GFR; Future  We discussed possible serious and likely etiologies, options for evaluation and workup, limitations of telemedicine visit vs in person visit, treatment, treatment risks and precautions. The patient was advised to call back or seek an in-person evaluation if the symptoms worsen or if the condition fails to improve as anticipated. I discussed the assessment and treatment plan with the patient. The patient was provided an opportunity to ask questions and all were answered. The patient agreed with the plan and demonstrated an understanding of the instructions.  Return in about 6 months (around 08/11/2024) for chronic problems. Fasting labs and X ray next week.  I, Fritz Jewel Wierda, acting as a scribe for Roisin Mones [place], Leon Mccullough., have documented all relevant documentation on the behalf of Leon Irigoyen [place], Leon Mccullough, as directed by  Jenevie Casstevens [place], Leon Mccullough while in the presence of Leon Nguyen [place], Leon Mccullough.   I, Norell Brisbin [place], Leon Mccullough, have reviewed all documentation for this  visit. The documentation on 02/09/24 for the exam, diagnosis, procedures, and orders are all accurate and complete.  Leon Mcentee [place], Leon Mccullough

## 2024-02-09 NOTE — Assessment & Plan Note (Signed)
 Continue Pravastatin  20 mg daily and low fat diet. He will call to arrange lab appt for FLP. Further recommendation will be given according to lipid panel results.

## 2024-02-09 NOTE — Assessment & Plan Note (Signed)
 Last hemoglobin A1c 5.8 in 11/2022. Encouraged consistency with a health lifestyle for diabetes prevention.

## 2024-02-09 NOTE — Assessment & Plan Note (Signed)
 General BP has been adequately controlled. Continue olmesartan  40 mg daily and propranolol  ER 60 mg daily. Continue monitoring BP regularly. Continue low-salt diet. Follow-up in 6 months.

## 2024-05-04 ENCOUNTER — Other Ambulatory Visit: Payer: Self-pay | Admitting: Family Medicine

## 2024-05-04 DIAGNOSIS — I10 Essential (primary) hypertension: Secondary | ICD-10-CM

## 2024-05-09 ENCOUNTER — Other Ambulatory Visit: Payer: Self-pay | Admitting: Family Medicine

## 2024-05-09 DIAGNOSIS — F411 Generalized anxiety disorder: Secondary | ICD-10-CM

## 2024-05-09 DIAGNOSIS — I10 Essential (primary) hypertension: Secondary | ICD-10-CM

## 2024-05-09 DIAGNOSIS — G43009 Migraine without aura, not intractable, without status migrainosus: Secondary | ICD-10-CM

## 2024-05-09 DIAGNOSIS — E785 Hyperlipidemia, unspecified: Secondary | ICD-10-CM

## 2024-06-25 ENCOUNTER — Other Ambulatory Visit (INDEPENDENT_AMBULATORY_CARE_PROVIDER_SITE_OTHER)

## 2024-06-25 ENCOUNTER — Other Ambulatory Visit

## 2024-06-25 DIAGNOSIS — R7303 Prediabetes: Secondary | ICD-10-CM

## 2024-06-25 DIAGNOSIS — I10 Essential (primary) hypertension: Secondary | ICD-10-CM

## 2024-06-25 DIAGNOSIS — E559 Vitamin D deficiency, unspecified: Secondary | ICD-10-CM | POA: Diagnosis not present

## 2024-06-25 DIAGNOSIS — E785 Hyperlipidemia, unspecified: Secondary | ICD-10-CM | POA: Diagnosis not present

## 2024-06-25 LAB — COMPREHENSIVE METABOLIC PANEL WITH GFR
ALT: 41 U/L (ref 0–53)
AST: 31 U/L (ref 0–37)
Albumin: 4.5 g/dL (ref 3.5–5.2)
Alkaline Phosphatase: 48 U/L (ref 39–117)
BUN: 13 mg/dL (ref 6–23)
CO2: 29 meq/L (ref 19–32)
Calcium: 9.3 mg/dL (ref 8.4–10.5)
Chloride: 103 meq/L (ref 96–112)
Creatinine, Ser: 0.82 mg/dL (ref 0.40–1.50)
GFR: 108.15 mL/min (ref >60.00)
Glucose, Bld: 96 mg/dL (ref 70–99)
Potassium: 4 meq/L (ref 3.5–5.1)
Sodium: 139 meq/L (ref 135–145)
Total Bilirubin: 0.5 mg/dL (ref 0.2–1.2)
Total Protein: 7 g/dL (ref 6.0–8.3)

## 2024-06-25 LAB — LIPID PANEL
Cholesterol: 148 mg/dL (ref 0–200)
HDL: 40.7 mg/dL (ref >39.00)
LDL Cholesterol: 81 mg/dL (ref 0–99)
NonHDL: 107.54
Total CHOL/HDL Ratio: 4
Triglycerides: 131 mg/dL (ref 0.0–149.0)
VLDL: 26.2 mg/dL (ref 0.0–40.0)

## 2024-06-25 LAB — VITAMIN D 25 HYDROXY (VIT D DEFICIENCY, FRACTURES): VITD: 34.87 ng/mL (ref 30.00–100.00)

## 2024-06-25 LAB — HEMOGLOBIN A1C: Hgb A1c MFr Bld: 5.9 % (ref 4.6–6.5)

## 2024-06-26 ENCOUNTER — Ambulatory Visit

## 2024-07-12 ENCOUNTER — Ambulatory Visit: Payer: Self-pay | Admitting: Family Medicine

## 2024-08-03 ENCOUNTER — Other Ambulatory Visit: Payer: Self-pay | Admitting: Family Medicine

## 2024-08-03 DIAGNOSIS — K219 Gastro-esophageal reflux disease without esophagitis: Secondary | ICD-10-CM

## 2024-09-12 ENCOUNTER — Encounter: Payer: Self-pay | Admitting: Family Medicine

## 2024-09-12 ENCOUNTER — Telehealth: Payer: Self-pay | Admitting: *Deleted

## 2024-09-12 NOTE — Telephone Encounter (Unsigned)
 Copied from CRM #[id number]. Topic: Clinical - Prescription Issue >> Sep 12, 2024 10:22 AM Victoria A wrote: Reason for CRM: Patient said that he would like for the CMA to call him regarding sertraline  (ZOLOFT ) 50 MG tablet and needing a new prescription sent to pharmacy. Please call [phone]

## 2024-09-13 ENCOUNTER — Other Ambulatory Visit: Payer: Self-pay | Admitting: Family Medicine

## 2024-09-13 DIAGNOSIS — F411 Generalized anxiety disorder: Secondary | ICD-10-CM

## 2024-09-13 MED ORDER — SERTRALINE HCL 50 MG PO TABS: 50.0000 mg | ORAL_TABLET | Freq: Every day | ORAL | 0 refills | Status: AC

## 2024-09-13 NOTE — Telephone Encounter (Signed)
 [id number]

## 2024-09-13 NOTE — Telephone Encounter (Signed)
 Last refill sent 04/2024, 90/3, so he is supposed to have enough medication until next 04/2025. Does he have a new pharmacy? If he needs a small prescription send to another pharmacy because travel and forgot medication, 30 days supply can be sent to pharmacy closer to where he is at this time. Thanks, BJ

## 2024-09-16 ENCOUNTER — Telehealth: Payer: Self-pay

## 2024-09-16 NOTE — Telephone Encounter (Signed)
 [id number]

## 2024-09-16 NOTE — Telephone Encounter (Signed)
 Medication was sent to [place], [place] - Patient has received medication.

## 2024-09-16 NOTE — Telephone Encounter (Signed)
 Called patients wife. She informed me that he cam to [place] to visit her and that's why he needs a refill on his medication. She was not sure which pharmacy he wanted to use so he is supposed to be calling me back to give me that information.

## 2024-09-16 NOTE — Telephone Encounter (Signed)
 Note from call back
